# Patient Record
Sex: Female | Born: 1960 | Race: Black or African American | Hispanic: No | Marital: Single | Smoking: Never smoker
Health system: Southern US, Community
[De-identification: ages and names within clinical notes are randomized; demographics above are authoritative.]

## PROBLEM LIST (undated history)

## (undated) DIAGNOSIS — E119 Type 2 diabetes mellitus without complications: Secondary | ICD-10-CM

## (undated) DIAGNOSIS — I10 Essential (primary) hypertension: Secondary | ICD-10-CM

## (undated) DIAGNOSIS — M545 Low back pain, unspecified: Secondary | ICD-10-CM

## (undated) DIAGNOSIS — N959 Unspecified menopausal and perimenopausal disorder: Secondary | ICD-10-CM

## (undated) HISTORY — DX: Low back pain, unspecified: M54.50

## (undated) HISTORY — DX: Essential (primary) hypertension: I10

## (undated) HISTORY — DX: Type 2 diabetes mellitus without complications: E11.9

## (undated) HISTORY — DX: Low back pain: M54.5

## (undated) HISTORY — PX: TUBAL LIGATION: SHX77

## (undated) HISTORY — DX: Unspecified menopausal and perimenopausal disorder: N95.9

---

## 2009-03-09 ENCOUNTER — Encounter: Payer: Self-pay | Admitting: Family Medicine

## 2009-03-09 ENCOUNTER — Other Ambulatory Visit: Admission: RE | Admit: 2009-03-09 | Discharge: 2009-03-09 | Payer: Self-pay | Admitting: Family Medicine

## 2009-03-09 ENCOUNTER — Ambulatory Visit: Payer: Self-pay | Admitting: Family Medicine

## 2009-03-09 DIAGNOSIS — I10 Essential (primary) hypertension: Secondary | ICD-10-CM

## 2009-03-09 LAB — CONVERTED CEMR LAB: Pap Smear: NORMAL

## 2009-03-12 LAB — CONVERTED CEMR LAB
AST: 17 units/L (ref 0–37)
Albumin: 4.9 g/dL (ref 3.5–5.2)
Alkaline Phosphatase: 42 units/L (ref 39–117)
BUN: 16 mg/dL (ref 6–23)
Calcium: 9.7 mg/dL (ref 8.4–10.5)
Chloride: 102 meq/L (ref 96–112)
Creatinine, Ser: 1.02 mg/dL (ref 0.40–1.20)
LDL Cholesterol: 127 mg/dL — ABNORMAL HIGH (ref 0–99)
Potassium: 4.2 meq/L (ref 3.5–5.3)
Total Bilirubin: 0.3 mg/dL (ref 0.3–1.2)
Triglycerides: 64 mg/dL (ref <150)
VLDL: 13 mg/dL (ref 0–40)

## 2009-04-20 ENCOUNTER — Ambulatory Visit: Payer: Self-pay | Admitting: Family Medicine

## 2009-04-23 LAB — CONVERTED CEMR LAB
BUN: 18 mg/dL (ref 6–23)
CO2: 28 meq/L (ref 19–32)
Chloride: 102 meq/L (ref 96–112)
Potassium: 4 meq/L (ref 3.5–5.3)

## 2009-06-01 ENCOUNTER — Ambulatory Visit: Payer: Self-pay | Admitting: Family Medicine

## 2009-06-01 DIAGNOSIS — R079 Chest pain, unspecified: Secondary | ICD-10-CM

## 2009-06-11 ENCOUNTER — Encounter: Payer: Self-pay | Admitting: Family Medicine

## 2009-07-23 ENCOUNTER — Telehealth: Payer: Self-pay | Admitting: Family Medicine

## 2009-07-23 ENCOUNTER — Telehealth (INDEPENDENT_AMBULATORY_CARE_PROVIDER_SITE_OTHER): Payer: Self-pay | Admitting: *Deleted

## 2009-08-17 ENCOUNTER — Encounter: Payer: Self-pay | Admitting: Family Medicine

## 2009-09-21 ENCOUNTER — Encounter: Admission: RE | Admit: 2009-09-21 | Discharge: 2009-09-21 | Payer: Self-pay | Admitting: Family Medicine

## 2009-09-21 ENCOUNTER — Ambulatory Visit: Payer: Self-pay | Admitting: Family Medicine

## 2009-09-21 DIAGNOSIS — M545 Low back pain: Secondary | ICD-10-CM

## 2009-10-11 ENCOUNTER — Ambulatory Visit: Payer: Self-pay | Admitting: Family Medicine

## 2009-10-11 DIAGNOSIS — L909 Atrophic disorder of skin, unspecified: Secondary | ICD-10-CM | POA: Insufficient documentation

## 2009-10-11 DIAGNOSIS — L919 Hypertrophic disorder of the skin, unspecified: Secondary | ICD-10-CM

## 2010-03-07 ENCOUNTER — Ambulatory Visit: Payer: Self-pay | Admitting: Family Medicine

## 2010-03-07 DIAGNOSIS — N920 Excessive and frequent menstruation with regular cycle: Secondary | ICD-10-CM

## 2010-03-08 LAB — CONVERTED CEMR LAB
ALT: 12 units/L (ref 0–35)
AST: 12 units/L (ref 0–37)
Albumin: 4.2 g/dL (ref 3.5–5.2)
BUN: 16 mg/dL (ref 6–23)
Calcium: 9.1 mg/dL (ref 8.4–10.5)
Chloride: 104 meq/L (ref 96–112)
Creatinine, Ser: 1.03 mg/dL (ref 0.40–1.20)
Glucose, Bld: 94 mg/dL (ref 70–99)
HCT: 35.9 % — ABNORMAL LOW (ref 36.0–46.0)
HDL: 50 mg/dL (ref >39)
Iron: 70 ug/dL (ref 42–145)
LDL Cholesterol: 129 mg/dL — ABNORMAL HIGH (ref 0–99)
MCHC: 34 g/dL (ref 30.0–36.0)
MCV: 86.3 fL (ref 78.0–100.0)
Potassium: 4.2 meq/L (ref 3.5–5.3)
RDW: 14.5 % (ref 11.5–15.5)
Saturation Ratios: 17 % — ABNORMAL LOW (ref 20–55)
TIBC: 419 ug/dL (ref 250–470)
Total Bilirubin: 0.4 mg/dL (ref 0.3–1.2)
Triglycerides: 56 mg/dL (ref <150)
WBC: 8.3 10*3/uL (ref 4.0–10.5)

## 2010-07-18 ENCOUNTER — Ambulatory Visit: Admit: 2010-07-18 | Payer: Self-pay | Admitting: Family Medicine

## 2010-07-30 NOTE — Progress Notes (Signed)
Summary: Requests Lisinopril HCTZ  Phone Note Call from Patient   Caller: Patient Summary of Call: Pt called for results of stress test. I called [facility] for a copy bc we do not have one.   Also Pt would like to d/c Azor and start Lisinopril HCTZ 20-25. Pt was on lisinopril a few years ago and did not have any SEs. Pt c/o Azor causing chest discomfort no pain or anyother Sxs. Please advise.  Initial call taken by: Payton Spark CMA,  July 23, 2009 1:45 PM    New/Updated Medications: LISINOPRIL-HYDROCHLOROTHIAZIDE 20-25 MG TABS (LISINOPRIL-HYDROCHLOROTHIAZIDE) 1 tab by mouth daily Prescriptions: LISINOPRIL-HYDROCHLOROTHIAZIDE 20-25 MG TABS (LISINOPRIL-HYDROCHLOROTHIAZIDE) 1 tab by mouth daily  #30 x 2   Entered and Authorized by:   Seymour Bars DO   Signed by:   Seymour Bars DO on 07/23/2009   Method used:   [facility]        [facility] [phone]* (retail)       [address] [place], [place]  [address]       Ph: [phone] or [phone]       Fax: [phone]   RxID:   [id number]   Appended Document: Requests Lisinopril HCTZ Pt aware

## 2010-07-30 NOTE — Medication Information (Signed)
Summary: Possible Nonadherence with Lisinopril/[employer]  Possible Nonadherence with Lisinopril/[employer]   Imported By: Lanelle Bal 08/23/2009 12:54:09  _____________________________________________________________________  External Attachment:    Type:   Image     Comment:   External Document

## 2010-07-30 NOTE — Assessment & Plan Note (Signed)
Summary: CPE , no pap   Vital Signs:  Patient profile:   50 year old [name] status:  regular Height:      67.5 inches Weight:      201 pounds BMI:     31.13 O2 Sat:      100 % on Room air Pulse rate:   97 / minute BP sitting:   125 / 80  (left arm) Cuff size:   large  Vitals Entered By: Payton Spark CMA (March 07, 2010 9:34 AM)  O2 Flow:  Room air CC: CPE w/ out pap   Primary Care Provider:  Seymour Bars DO  CC:  CPE w/ out pap.  History of Present Illness: Marissa Ferrell presents for CPE w/o pap smear.  She is going thru perimenopause with periods occuring q 2-4 wks but w/o heavy flow.  She had a BTL for birth control.  [facility]  Denies fam hx of premature heart dz, colon cancer or breast cacner.  She is doing well on BP meds.  Due for RF and fasting labs today.  Tetanus and pap updated 1 yr ago.  No new sexual partners.    Current Medications (verified): 1)  Lisinopril-Hydrochlorothiazide 20-25 Mg Tabs (Lisinopril-Hydrochlorothiazide) .Marland Kitchen.. 1 Tab By Mouth Daily  Allergies (verified): No Known Drug Allergies  Past History:  Past Medical History: Reviewed history from Marissa/22/2010 and no changes required. premenopausal. G3P3 LBP HTN  Past Surgical History: Reviewed history from 09/Marissa/2010 and no changes required. BTL  Family History: Reviewed history from 09/Marissa/2010 and no changes required. 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  Older son and daughter live out of state. Never smoked.   2 ETOH/ wk. No exercise.  Review of Systems  The patient denies anorexia, fever, weight loss, weight gain, vision loss, decreased hearing, hoarseness, chest pain, syncope, dyspnea on exertion, peripheral edema, prolonged cough, headaches,  hemoptysis, abdominal pain, melena, hematochezia, severe indigestion/heartburn, hematuria, incontinence, genital sores, muscle weakness, suspicious skin lesions, transient blindness, difficulty walking, depression, unusual weight change, abnormal bleeding, enlarged lymph nodes, angioedema, breast masses, and testicular masses.    Physical Exam  General:  alert, well-developed, well-nourished, well-hydrated, and overweight-appearing.   Head:  normocephalic and atraumatic.   Eyes:  pupils equal, pupils round, and pupils reactive to light.   Ears:  no external deformities.   Nose:  no nasal discharge.   Mouth:  good dentition and pharynx pink and moist.   Neck:  no masses.   Lungs:  Normal respiratory effort, chest expands symmetrically. Lungs are clear to auscultation, no crackles or wheezes. Heart:  Normal rate and regular rhythm. S1 and S2 normal without gallop, murmur, click, rub or other extra sounds. Abdomen:  Bowel sounds positive,abdomen soft and non-tender without masses, organomegaly, no AA bruits Pulses:  2+ radial and pedal pulses Extremities:  no LE edema Skin:  color normal.   Cervical Nodes:  No lymphadenopathy noted Psych:  good eye contact, not anxious appearing, and not depressed appearing.     Impression & Recommendations:  Problem # 1:  PHYSICAL EXAMINATION (ICD-V70.0) Keeping healthy checklist for women reviewed. BP at goal -- RFd BP meds. BMI 31 c/w class I obesity.  Work on [employer], exercise, wt loss. Tetanus updated 2010. Pap normal 2010, repeat 2012.  Mammo updated June 2011. Update fasting labs today.   RTC in 6 mos.  Complete Medication List: 1)  Lisinopril-hydrochlorothiazide 20-25 Mg Tabs (Lisinopril-hydrochlorothiazide) .Marland Kitchen.. 1 tab by mouth daily  Other Orders: T-Comprehensive Metabolic Panel [id number]) T-Lipid Profile [id number]) T-CBC No Diff ([id number]) T-Iron ([id number]) T-Iron Binding Capacity (TIBC) ([id number]) T-TSH  ([id number])  Patient Instructions: 1)  RFd BP meds today. 2)  Update fasting labs downstairs. 3)  Will call you w/ results tomorrow. 4)  Return for HTN in 6 mos. Prescriptions: LISINOPRIL-HYDROCHLOROTHIAZIDE 20-25 MG TABS (LISINOPRIL-HYDROCHLOROTHIAZIDE) 1 tab by mouth daily  #30 Tablet x 12   Entered and Authorized by:   Seymour Bars DO   Signed by:   Seymour Bars DO on 03/07/2010   Method used:   [facility] to        [facility] [phone]* (retail)       [address] [place], [place]  [address]       Ph: [phone] or [phone]       Fax: [phone]   RxID:   [id number]

## 2010-07-30 NOTE — Progress Notes (Signed)
Summary: stress echo normal  Phone Note Outgoing Call   Summary of Call: Marcelino Duster, Pls call pt and let her know that her Stress Echo looks great.  No abnormalities seen.  She has the green light to do vigorous exercise.   Initial call taken by: Seymour Bars DO,  July 23, 2009 4:28 PM  Follow-up for Phone Call        Pt aware Follow-up by: Payton Spark CMA,  July 24, 2009 11:35 AM
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
## 2010-07-30 NOTE — Assessment & Plan Note (Signed)
Summary: f/u back pain   Vital Signs:  Patient profile:   50 year old female Menstrual status:  regular Height:      67.5 inches Weight:      204 pounds BMI:     31.59 O2 Sat:      98 % on Room air Pulse rate:   103 / minute BP sitting:   119 / 81  (left arm) Cuff size:   large  Vitals Entered By: Payton Spark CMA (October 11, 2009 8:36 AM)  O2 Flow:  Room air CC: F/U back pain. Pain is better w/ meds but comes and goes.    Primary Care Provider:  Seymour Bars DO  CC:  F/U back pain. Pain is better w/ meds but comes and goes. .  History of Present Illness: 50 yo AAF presents for f/u LBP.  She feels about 50% better from last visit 2 wks ago.  She was treated here with Ibuprofen 800 mg three times a day with meals, Flexeril at night and home PT stretches.  She had an XRAY that was essentially normal.  She has hx of chronic LBP.  Has been walking more.  Denies any radiation of her LBP.    She is still needing to take Ibuprofen 2 x a wk.  She wants to hold off on formal PT due to her work schedule.  She has a bothersome skin tag on the R axillae which she would like removed.      Current Medications (verified): 1)  Lisinopril-Hydrochlorothiazide 20-25 Mg Tabs (Lisinopril-Hydrochlorothiazide) .Marland Kitchen.. 1 Tab By Mouth Daily 2)  Ibuprofen 800 Mg Tabs (Ibuprofen) .Marland Kitchen.. 1 Tab By Mouth Three Times A Day With Meals X 10 Days 3)  Flexeril 5 Mg Tabs (Cyclobenzaprine Hcl) .Marland Kitchen.. 1-2 Tabs By Mouth At Bedtime As Needed Back Pain  Allergies (verified): No Known Drug Allergies  Past History:  Past Medical History: Reviewed history from 04/20/2009 and no changes required. premenopausal. 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  Older son and daughter live out of state. Never smoked.   2 ETOH/ wk. No exercise.  Review of Systems      See HPI  Physical  Exam  General:  alert, well-developed, well-nourished, and well-hydrated.  obese Head:  normocephalic and atraumatic.   Eyes:  pupils equal, pupils round, and pupils reactive to light.   Lungs:  Normal respiratory effort, chest expands symmetrically. Lungs are clear to auscultation, no crackles or wheezes. Heart:  Normal rate and regular rhythm. S1 and S2 normal without gallop, murmur, click, rub or other extra sounds. Msk:  full active L spine ROM slightly tender midline L4-S1 Neg seated straight leg raise normal gait Extremities:  no LE edema Neurologic:  +2/4 patellar DTRs Skin:  color normal.  medium sized pedunculated skin tag under the R axillae   Impression & Recommendations:  Problem # 1:  BACK PAIN, LUMBAR (ICD-724.2) Assessment Improved Her acute on chronic LBP has improved about 50% with increased walking, flexeril at night.  She needs to work more on home PT exercises, wt loss.  She has backed off on NSAID use to just as needed.  XRay was essentially normal.  Call if failing to improve -- to schedule formal PT. Her updated medication list for this problem includes:    Ibuprofen 800 Mg Tabs (Ibuprofen) .Marland Kitchen... 1 tab by mouth three times a day with  meals x 10 days    Flexeril 5 Mg Tabs (Cyclobenzaprine hcl) .Marland Kitchen... 1-2 tabs by mouth at bedtime as needed back pain  Problem # 2:  ESSENTIAL HYPERTENSION, BENIGN (ICD-401.1) Assessment: Improved  Her updated medication list for this problem includes:    Lisinopril-hydrochlorothiazide 20-25 Mg Tabs (Lisinopril-hydrochlorothiazide) .Marland Kitchen... 1 tab by mouth daily  BP today: 119/81 Prior BP: 152/88 (09/21/2009)  Labs Reviewed: K+: 4.0 (04/20/2009) Creat: : 1.14 (04/20/2009)   Chol: 204 (03/09/2009)   HDL: 64 (03/09/2009)   LDL: 127 (03/09/2009)   TG: 64 (03/09/2009)  Problem # 3:  SKIN TAG (ICD-701.9) R axillary skin tag frozen at base with liquid nitrogen. Wound care measures discussed. If it fails to fall off, she will need to  return to snip it. Will not charge her in the event that she needs to return for the same lesion.  Complete Medication List: 1)  Lisinopril-hydrochlorothiazide 20-25 Mg Tabs (Lisinopril-hydrochlorothiazide) .Marland Kitchen.. 1 tab by mouth daily 2)  Ibuprofen 800 Mg Tabs (Ibuprofen) .Marland Kitchen.. 1 tab by mouth three times a day with meals x 10 days 3)  Flexeril 5 Mg Tabs (Cyclobenzaprine hcl) .Marland Kitchen.. 1-2 tabs by mouth at bedtime as needed back pain  Patient Instructions: 1)  Skin tag will fall off.  Clean with soap and water and use polysporin ointment to prevent infection for the next wk. 2)  Continue regular walking, low back stretching with as needed use of Ibuprofen for back pain. 3)  Call if you want to add some formal physical therapy. Prescriptions: FLEXERIL 5 MG TABS (CYCLOBENZAPRINE HCL) 1-2 tabs by mouth at bedtime as needed back pain  #24 x 0   Entered and Authorized by:   Seymour Bars DO   Signed by:   Seymour Bars DO on 10/11/2009   Method used:   Electronically to        [facility] [phone]* (retail)       [address] [place], [place]  [address]       Ph: [phone] or [phone]       Fax: [phone]   RxID:   [id number]

## 2010-09-04 ENCOUNTER — Encounter: Payer: Self-pay | Admitting: Family Medicine

## 2010-09-04 ENCOUNTER — Ambulatory Visit (INDEPENDENT_AMBULATORY_CARE_PROVIDER_SITE_OTHER): Payer: Commercial Indemnity | Admitting: Family Medicine

## 2010-09-04 DIAGNOSIS — I10 Essential (primary) hypertension: Secondary | ICD-10-CM

## 2010-09-04 DIAGNOSIS — H9209 Otalgia, unspecified ear: Secondary | ICD-10-CM | POA: Insufficient documentation

## 2010-09-10 NOTE — Assessment & Plan Note (Signed)
Summary: cerumen impaction   Vital Signs:  Patient profile:   50 year old female Menstrual status:  regular Height:      67.5 inches Weight:      205 pounds BMI:     31.75 O2 Sat:      99 % on Room air Pulse rate:   92 / minute BP sitting:   154 / 99  (left arm) Cuff size:   large  Vitals Entered By: Payton Spark CMA (September 04, 2010 8:37 AM)  O2 Flow:  Room air CC: L aer pain x months.   Primary Care Provider:  Seymour Bars DO  CC:  L aer pain x months..  History of Present Illness: 50 yo AAF presents for L > R ear pain x 3 mos.  She denies sore throat, runny nose, allergies.  Denies any HAs.  No fevers.  No drainage.  Denies tinnitus or hearing loss.  No hx of ear problems.  Has not been taking anything.  She has a constant deep aching.  She has been using Q tips.  Denies pain in her jaw or tooth pain with chewing.  Current Medications (verified): 1)  Lisinopril-Hydrochlorothiazide 20-25 Mg Tabs (Lisinopril-Hydrochlorothiazide) .Marland Kitchen.. 1 Tab By Mouth Daily  Allergies (verified): No Known Drug Allergies  Past History:  Past Medical History: Reviewed history from 04/20/2009 and no changes required. premenopausal. 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  Older son and daughter live out of state. Never smoked.   2 ETOH/ wk. No exercise.  Review of Systems      See HPI  Physical Exam  General:  alert, well-developed, well-nourished, and well-hydrated.   Head:  normocephalic and atraumatic.   Eyes:  conjunctiva clear Ears:  R TM normal.  deep small L cerumen impaction Nose:  no nasal discharge.   Mouth:  pharynx pink and moist.   Neck:  no masses.   Lungs:  Normal respiratory effort, chest expands symmetrically. Lungs are clear to auscultation, no crackles or wheezes. Heart:  Normal rate and regular rhythm. S1 and S2 normal without gallop,  murmur, click, rub or other extra sounds. Extremities:  no LE edema Skin:  color normal.   Cervical Nodes:  No lymphadenopathy noted Psych:  good eye contact, not anxious appearing, and flat affect.     Impression & Recommendations:  Problem # 1:  EAR PAIN, LEFT (ICD-388.70) L ear pain likley to be purely from deep L cerumen impaction secondary to use of Q tips. Will irrigate today and if unsuccessful, refer to ENT for microscopic removal.--> successfully removed and TM appears normal. Avoid Q tips -- change to baby safety swabs. Hx / exam neg for other causes of referred ear pain.  Problem # 2:  ESSENTIAL HYPERTENSION, BENIGN (ICD-401.1) BP high today, previously at goal.  Has not taken her med yet this morning. Pain may be raising it higher.  Reminded pt to take her medicine today and will see her back for f/u in 1 month. Her updated medication list for this problem includes:    Lisinopril-hydrochlorothiazide 20-25 Mg Tabs (Lisinopril-hydrochlorothiazide) .Marland Kitchen... 1 tab by mouth daily  BP today: 154/99 Prior BP: 125/80 (03/07/2010)  Labs Reviewed: K+: 4.2 (03/07/2010) Creat: : 1.03 (03/07/2010)   Chol: 190 (03/07/2010)   HDL: 50 (03/07/2010)   LDL: 129 (03/07/2010)   TG: 56 (03/07/2010)  Complete Medication List: 1)  Lisinopril-hydrochlorothiazide  20-25 Mg Tabs (Lisinopril-hydrochlorothiazide) .Marland Kitchen.. 1 tab by mouth daily  Patient Instructions: 1)  L ear irrigated today. 2)  Avoid Qtips -- changes to the thicker baby Qtips (saftey Q tips). 3)  Take BP medicine everyday. 4)  REturn for f/u in 1 month.   Orders Added: 1)  Est. Patient Level III [[address]]

## 2011-04-13 ENCOUNTER — Encounter: Payer: Self-pay | Admitting: Family Medicine

## 2011-04-13 ENCOUNTER — Inpatient Hospital Stay (INDEPENDENT_AMBULATORY_CARE_PROVIDER_SITE_OTHER)
Admission: RE | Admit: 2011-04-13 | Discharge: 2011-04-13 | Disposition: A | Discharge status: Home / Self Care | Payer: Commercial Indemnity | Source: Ambulatory Visit | Attending: Family Medicine | Admitting: Family Medicine

## 2011-04-13 DIAGNOSIS — L259 Unspecified contact dermatitis, unspecified cause: Secondary | ICD-10-CM

## 2011-04-13 DIAGNOSIS — I10 Essential (primary) hypertension: Secondary | ICD-10-CM

## 2011-04-19 ENCOUNTER — Telehealth (INDEPENDENT_AMBULATORY_CARE_PROVIDER_SITE_OTHER): Payer: Self-pay

## 2011-04-28 ENCOUNTER — Telehealth (INDEPENDENT_AMBULATORY_CARE_PROVIDER_SITE_OTHER): Payer: Self-pay | Admitting: *Deleted

## 2011-05-01 ENCOUNTER — Encounter: Payer: Self-pay | Admitting: Family Medicine

## 2011-05-08 ENCOUNTER — Encounter: Payer: Self-pay | Admitting: Family Medicine

## 2011-05-08 ENCOUNTER — Ambulatory Visit (INDEPENDENT_AMBULATORY_CARE_PROVIDER_SITE_OTHER): Payer: Commercial Indemnity | Admitting: Family Medicine

## 2011-05-08 VITALS — BP 162/99 | Ht 66.0 in | Wt 213.0 lb

## 2011-05-08 DIAGNOSIS — L989 Disorder of the skin and subcutaneous tissue, unspecified: Secondary | ICD-10-CM

## 2011-05-08 DIAGNOSIS — I10 Essential (primary) hypertension: Secondary | ICD-10-CM

## 2011-05-08 MED ORDER — LISINOPRIL-HYDROCHLOROTHIAZIDE 20-25 MG PO TABS: 1.0000 | ORAL_TABLET | Freq: Every day | ORAL | Status: DC

## 2011-05-08 MED ORDER — VERAPAMIL HCL ER 180 MG PO TBCR: 180.0000 mg | EXTENDED_RELEASE_TABLET | Freq: Every day | ORAL | Status: DC

## 2011-05-08 NOTE — Patient Instructions (Signed)
Excision of Skin Lesions Excision of a skin lesion refers to the removal of a section of skin by making small cuts (incisions) in the skin. This is typically done to remove a cancerous growth (basal cell carcinoma, squamous cell carcinoma, or melanoma) or a noncancerous growth (cyst). It may be done to treat or prevent cancer or infection. It may also be done to improve cosmetic appearance (removal of mole, skin tag). LET YOUR CAREGIVER KNOW ABOUT:   Allergies to food or medicine.   Medicines taken, including vitamins, herbs, eyedrops, over-the-counter medicines, and creams.   Use of steroids (by mouth or creams).   Previous problems with anesthetics or numbing medicines.   History of bleeding problems or blood clots.   History of any prostheses.   Previous surgery.   Other health problems, including diabetes and kidney problems.   Possibility of pregnancy, if this applies.  RISKS AND COMPLICATIONS  Many complications can be managed. With appropriate treatment and rehabilitation, the following complications are very uncommon:  Bleeding.   Infection.   Scarring.   Recurrence of cyst or cancer.   Changes in skin sensation or appearance (discoloration, swelling).   Reaction to anesthesia.   Allergic reaction to surgical materials or ointments.   Damage to nerves, blood vessels, muscles, or other structures.   Continued pain.  BEFORE THE PROCEDURE  It is important to follow your caregiver's instructions prior to your procedure to avoid complications. Steps before your procedure may include:  Physical exam, blood tests, other procedures, such as removing a small sample for examination under a microscope (biopsy).   Your caregiver may review the procedure, the anesthesia being used, and what to expect after the procedure with you.  You may be asked to:  Stop taking certain medicines, such as blood thinners (including aspirin, clopidogrel, ibuprofen), for several days prior  to your procedure.   Take certain medicines.   Stop smoking.  It is a good idea to arrange for a ride home after surgery and to have someone to help you with activities during recovery. PROCEDURE  There are several excision techniques. The type of excision or surgical technique used will depend on your condition, the location of the lesion, and your overall health. After the lesion is sterilized and a local anesthetic is applied, the following may be performed: Complete surgical excision The area to be removed is marked with a pen. Using a small scalpel and scissors, the surgeon gently cuts around and under the lesion until it is completely removed. The lesion is placed in a special fluid and sent to the lab for examination. If necessary, bleeding will be controlled with a device that delivers heat. The edges of the wound are stitched together and a dressing is applied. This procedure may be performed to treat a cancerous growth or noncancerous cyst or lesion. Surgeons commonly perform an elliptical excision, to minimize scarring. Excision of a cyst The surgeon makes an incision on the cyst. The entire cyst is removed through the incision. The wound may be closed with a suture (stitch). Shave excision During shave excision, the surgeon uses a small blade or loop instrument to shave off the lesion. This may be done to remove a mole or skin tag. The wound is usually left to heal on its own without stitches. Punch excision During punch excision, the surgeon uses a small, round tool (like a cookie cutter) to cut a circle shape out of the skin. The outer edges of the skin are stitched  together. This may be done to remove a mole or scar or to perform a biopsy of the lesion. Mohs micrographic surgery During Mohs micrographic surgery, layers of the lesion are removed with a scalpel or loop instrument and immediately examined under a microscope until all of the abnormal or cancerous tissue is removed. This  procedure is minimally invasive and ensures the best cosmetic outcome, with removal of as little normal tissue as possible. Mohs is usually done to treat skin cancer, such as basal cell carcinoma or squamous cell carcinoma, particularly on the face and ears. Antibiotic ointment is applied to the surgical area after each of the procedures listed above, as necessary. AFTER THE PROCEDURE  How well you heal depends on many factors. Most patients heal quite well with proper techniques and self-care. Scarring will lessen over time. HOME CARE INSTRUCTIONS   Take medicines for pain as directed.   Keep the incision area clean, dry, and protected for at least 48 hours. Change dressings as directed.   For bleeding, apply gentle but firm pressure to the wound using a folded towel for 20 minutes. Call your caregiver if bleeding does not stop.   Avoid high-impact exercise and activities until the stitches are removed or the area heals.   Follow your caregiver's instructions to minimize scarring. Avoid sun exposure until the area has healed. Scarring should lessen over time.   Follow up with your caregiver as directed. Removal of stitches within 4 to 14 days may be necessary.  Finding out the results of your test Not all test results are available during your visit. If your test results are not back during the visit, make an appointment with your caregiver to find out the results. Do not assume everything is normal if you have not heard from your caregiver or the medical facility. It is important for you to follow up on all of your test results. SEEK MEDICAL CARE IF:   You or your child has an oral temperature above 102 F (38.9 C).   You develop signs of infection (chills, feeling unwell).   You notice bleeding, pain, discharge, redness, or swelling at the incision site.   You notice skin irregularities or changes in sensation.  MAKE SURE YOU:   Understand these instructions.   Will watch your  condition.   Will get help right away if you are not doing well or get worse.  FOR MORE INFORMATION  American Academy [employer]: www.https://powers.com/ American Academy of Dermatology: InfoExam.si Document Released: 09/10/2009 Document Revised: 02/26/2011 Document Reviewed: 09/10/2009 [facility] Patient Information 2012 [place], [place].Hypertension As your heart beats, it forces blood through your arteries. This force is your blood pressure. If the pressure is too high, it is called hypertension (HTN) or high blood pressure. HTN is dangerous because you may have it and not know it. High blood pressure may mean that your heart has to work harder to pump blood. Your arteries may be narrow or stiff. The extra work puts you at risk for heart disease, stroke, and other problems.  Blood pressure consists of two numbers, a higher number over a lower, 110/72, for example. It is stated as "110 over 72." The ideal is below 120 for the top number (systolic) and under 80 for the bottom (diastolic). Write down your blood pressure today. You should pay close attention to your blood pressure if you have certain conditions such as:  Heart failure.   Prior heart attack.   Diabetes   Chronic kidney disease.  Prior stroke.   Multiple risk factors for heart disease.  To see if you have HTN, your blood pressure should be measured while you are seated with your arm held at the level of the heart. It should be measured at least twice. A one-time elevated blood pressure reading (especially in the Emergency Department) does not mean that you need treatment. There may be conditions in which the blood pressure is different between your right and left arms. It is important to see your caregiver soon for a recheck. Most people have essential hypertension which means that there is not a specific cause. This type of high blood pressure may be lowered by changing lifestyle factors such as:  Stress.   Smoking.   Lack  of exercise.   Excessive weight.   Drug/tobacco/alcohol use.   Eating less salt.  Most people do not have symptoms from high blood pressure until it has caused damage to the body. Effective treatment can often prevent, delay or reduce that damage. TREATMENT  When a cause has been identified, treatment for high blood pressure is directed at the cause. There are a large number of medications to treat HTN. These fall into several categories, and your caregiver will help you select the medicines that are best for you. Medications may have side effects. You should review side effects with your caregiver. If your blood pressure stays high after you have made lifestyle changes or started on medicines,   Your medication(s) may need to be changed.   Other problems may need to be addressed.   Be certain you understand your prescriptions, and know how and when to take your medicine.   Be sure to follow up with your caregiver within the time frame advised (usually within two weeks) to have your blood pressure rechecked and to review your medications.   If you are taking more than one medicine to lower your blood pressure, make sure you know how and at what times they should be taken. Taking two medicines at the same time can result in blood pressure that is too low.  SEEK IMMEDIATE MEDICAL CARE IF:  You develop a severe headache, blurred or changing vision, or confusion.   You have unusual weakness or numbness, or a faint feeling.   You have severe chest or abdominal pain, vomiting, or breathing problems.  MAKE SURE YOU:   Understand these instructions.   Will watch your condition.   Will get help right away if you are not doing well or get worse.  Document Released: 06/16/2005 Document Revised: 02/26/2011 Document Reviewed: 02/04/2008 [facility] Patient Information 2012 [place], [place].

## 2011-05-08 NOTE — Progress Notes (Signed)
  Subjective:    Patient ID: Marissa Ferrell, female    DOB: [birth year], 51 y.o.   MRN: [id number]  HPI Patient has history of hypertension she's been on Prinzide for least a year 20/25 and initially uncontrolled blood pressure but states the last several months at work they've noticed her blood pressures continued to go up. Pressure medicine before but does not know the name. #2 mild hyperlipidemia we'll need to check her cholesterol in the near future if his LDL cholesterol has been staying we need to. #3 skin lesion she reports multiple skin lesions on her chest one on the right chest under poor and 2 on the left under. Because of the location these lesions loation with her bra they have  become irritated and causing some discomfort as well as.    Review of Systems    Essentialy negative.  Objective:   Physical Exam  Constitutional: She is oriented to person, place, and time. She appears well-developed and well-nourished.  HENT:  Head: Normocephalic and atraumatic.  Neck: Normal range of motion. Neck supple.  Cardiovascular: Normal rate, regular rhythm and normal heart sounds.   Pulmonary/Chest: Effort normal and breath sounds normal.  Neurological: She is alert and oriented to person, place, and time.  Skin: Skin is warm and dry.   03 pedunculated lesions on the chest wall and one on the left is about 0.5 cm the other 2 smaller ones are pretty irritated from her bra rubbing against them.  BP 162/99  Ht 5\' 6"  (1.676 m)  Wt 213 lb (96.616 kg)  BMI 34.38 kg/m2  SpO2 96%  LMP 10/29/2010  Procedure note using towel surgery done the 3 lesions along the chest wall were frozen she tolerated procedure well hopefully these skin lesions will now followup known we'll recheck in 4 months when she returns.     Assessment & Plan:    # 1 hypertension under poor control continue he is Zestoretic 20/25 we'll add verapamil or Kaelin 180 sustained release once by mouth daily. Followup of 4 months for  followup  #2 health maintenance schedule for physical when she returns in 4 months  #3 skin lesions we'll retreat if they returned or if not sloughed offin 4 months. #4 followup of cholesterol when she wreturns

## 2011-05-26 ENCOUNTER — Telehealth: Payer: Self-pay | Admitting: *Deleted

## 2011-05-26 MED ORDER — CYCLOBENZAPRINE HCL 5 MG PO TABS: 5.0000 mg | ORAL_TABLET | Freq: Two times a day (BID) | ORAL | Status: DC | PRN

## 2011-05-26 NOTE — Telephone Encounter (Signed)
Refill on felxeril needed

## 2011-06-02 NOTE — Telephone Encounter (Signed)
  Phone Note Call from Patient   Caller: Patient Reason for Call: Talk to Nurse Summary of Call: Patient reports she is on her last day of her antibiotic and is still having some drainage. After consulting with Dr. Orson Aloe, I called in a refill of the cipro for 5 days. I encouraged her to followup with her PCPs office. Initial call taken by: Lajean Saver RN,  April 28, 2011 8:22 AM

## 2011-06-02 NOTE — Progress Notes (Signed)
Summary: ALLERGIC REACTION hair dye; out of HTN meds   Vital Signs:  Patient Profile:   50 Years Old Female CC:      rxn to hair dye; out of anti-hypertensive meds (Dr.Bowen) Height:     67.5 inches Weight:      210 pounds O2 Sat:      98 % O2 treatment:    Room Air Temp:     98.1 degrees F oral Pulse rate:   88 / minute Resp:     16 per minute BP sitting:   150 / 89  (left arm) Cuff size:   large  Pt. in pain?   no  Vitals Entered By: Lavell Islam RN (April 13, 2011 1:38 PM)              Comments has been out of lisinopril x 1 week      Updated Prior Medication List: LISINOPRIL-HYDROCHLOROTHIAZIDE 20-25 MG TABS (LISINOPRIL-HYDROCHLOROTHIAZIDE) 1 tab by mouth daily  Current Allergies: ! * POSSIBLE HAIR DYEHistory of Present Illness Chief Complaint: rxn to hair dye; out of anti-hypertensive meds (Dr.Bowen) History of Present Illness:  Subjective:  Patient dyed her hair 3 days ago and subsequently developed swelling and clear drainage from her scalp.  Minimal pain.  Swelling is now extending to forehead.  No fevers, chills, and sweats  She states that she has run out of her BP meds.  She reports no adverse reaction to her medication and tolerates well.  REVIEW OF SYSTEMS Constitutional Symptoms      Denies fever, chills, night sweats, weight loss, weight gain, and fatigue.  Eyes       Denies change in vision, eye pain, eye discharge, glasses, contact lenses, and eye surgery. Ear/Nose/Throat/Mouth       Denies hearing loss/aids, change in hearing, ear pain, ear discharge, dizziness, frequent runny nose, frequent nose bleeds, sinus problems, sore throat, hoarseness, and tooth pain or bleeding.  Respiratory       Denies dry cough, productive cough, wheezing, shortness of breath, asthma, bronchitis, and emphysema/COPD.  Cardiovascular       Denies murmurs, chest pain, and tires easily with exhertion.    Gastrointestinal       Denies stomach pain, nausea/vomiting,  diarrhea, constipation, blood in bowel movements, and indigestion. Genitourniary       Denies painful urination, kidney stones, and loss of urinary control. Neurological       Denies paralysis, seizures, and fainting/blackouts. Musculoskeletal       Denies muscle pain, joint pain, joint stiffness, decreased range of motion, redness, swelling, muscle weakness, and gout.  Skin       Denies bruising, unusual mles/lumps or sores, and hair/skin or nail changes.      Comments: face/hair line Psych       Denies mood changes, temper/anger issues, anxiety/stress, speech problems, depression, and sleep problems. Other Comments: possible rxn hair dye; out of anti-hypertensive rx   Past History:  Past Medical History: Reviewed history from 04/20/2009 and no changes required. premenopausal. G3P3 LBP HTN  Past Surgical History: Reviewed history from 03/09/2009 and no changes required. BTL  Family History: Reviewed history from 03/09/2009 and no changes required. father died of pneumonia mother HTN 4 brothers and 4 sisters healthy   Objective:  No acute distress  Scalp:  Clear mucoid drainage from scalp at various locations. Face:  Mild swelling of forehead at scalp margin; no erythema or tenderness Eyes:  Pupils are equal, round, and reactive to light and accomodation.  Extraocular movement is intact.  Conjunctivae are not inflamed.  Mouth:  No lesions Neck:  Supple.  No adenopathy is present.  No thyromegaly is present  Lungs:  Clear to auscultation.  Breath sounds are equal.  Heart:  Regular rate and rhythm without murmurs, rubs, or gallops.  Abdomen:  Nontender without masses or hepatosplenomegaly.  Bowel sounds are present.  No CVA or flank tenderness.  Extremities:  No edema.   Assessment New Problems: HYPERTENSION, BENIGN ESSENTIAL (ICD-401.1) CONTACT DERMATITIS (ICD-692.9)  SUSPECT SCALP SECONDARY BACTERIAL INFECTION  Plan New Medications/Changes: CEPHALEXIN 500 MG TABS  (CEPHALEXIN) One by mouth three times daily (every 8 hours)  #30 x 0, 04/13/2011, Donna Christen MD PREDNISONE 10 MG TABS (PREDNISONE) 2 PO BID for 2 days, then 1 BID for 2 days, then 1 daily for 2 days.  Take PC (Start Monday)  #14 x 0, 04/13/2011, Donna Christen MD LISINOPRIL-HYDROCHLOROTHIAZIDE 20-25 MG TABS (LISINOPRIL-HYDROCHLOROTHIAZIDE) 1 tab by mouth daily  #90 x 0, 04/13/2011, Donna Christen MD  New Orders: Depo- Medrol 80mg  [J1040] T-Culture, Wound [[id number]] Admin of Therapeutic Inj  intramuscular or subcutaneous [96372] New Patient Level IV [99204] Services provided After hours-Weekends-Holidays [99051] Planning Comments:   Wound culture taken.  Begin Keflex. DepoMedrol 80mg  IM; tomorrow begin tapering course of prednisone. Follow-up with PCP if not improving 2 to 3 days. Refill BP med; follow-up with PCP for BP.   The patient and/or caregiver has been counseled thoroughly with regard to medications prescribed including dosage, schedule, interactions, rationale for use, and possible side effects and they verbalize understanding.  Diagnoses and expected course of recovery discussed and will return if not improved as expected or if the condition worsens. Patient and/or caregiver verbalized understanding.  Prescriptions: CEPHALEXIN 500 MG TABS (CEPHALEXIN) One by mouth three times daily (every 8 hours)  #30 x 0   Entered and Authorized by:   Donna Christen MD   Signed by:   Donna Christen MD on 04/13/2011   Method used:   Print then Give to Patient   RxID:   [id number] PREDNISONE 10 MG TABS (PREDNISONE) 2 PO BID for 2 days, then 1 BID for 2 days, then 1 daily for 2 days.  Take PC (Start Monday)  #14 x 0   Entered and Authorized by:   Donna Christen MD   Signed by:   Donna Christen MD on 04/13/2011   Method used:   Print then Give to Patient   RxID:   [id number] LISINOPRIL-HYDROCHLOROTHIAZIDE 20-25 MG TABS (LISINOPRIL-HYDROCHLOROTHIAZIDE) 1 tab by mouth daily  #90 x  0   Entered and Authorized by:   Donna Christen MD   Signed by:   Donna Christen MD on 04/13/2011   Method used:   Print then Give to Patient   RxID:   [id number]   Medication Administration  Injection # 1:    Medication: Depo- Medrol 80mg     Diagnosis: CONTACT DERMATITIS (ICD-692.9)    Route: IM    Site: LUOQ gluteus    Exp Date: 05/2011    Lot #: [id number]    Patient tolerated injection without complications    Given by: Lavell Islam RN (April 13, 2011 2:16 PM)  Orders Added: 1)  Depo- Medrol 80mg  [J1040] 2)  T-Culture, Wound [[id number]] 3)  Admin of Therapeutic Inj  intramuscular or subcutaneous [96372] 4)  New Patient Level IV [99204] 5)  Services provided After hours-Weekends-Holidays [99051]

## 2011-06-02 NOTE — Telephone Encounter (Signed)
Summary: RX change request  Phone Note Call from Patient   Summary of Call: Pt. called requesting "stronger"Atb then what was RX'd at previous visit. States "not working". Please advise.[phone] [facility]. [place]    New/Updated Medications: CIPROFLOXACIN HCL 500 MG TABS (CIPROFLOXACIN HCL) 1 by mouth q12hr Prescriptions: CIPROFLOXACIN HCL 500 MG TABS (CIPROFLOXACIN HCL) 1 by mouth q12hr  #20 x 0   Entered and Authorized by:   Donna Christen MD   Signed by:   Donna Christen MD on 04/20/2011   Method used:   Electronically to        [facility] [phone]* (retail)       [address] [place], [place]  [address]       Ph: [phone] or [phone]       Fax: [phone]   RxID:   [id number]  Discussed with patient; she states that she received new Rx yesterday. Donna Christen MD  April 20, 2011 1:54 PM

## 2011-08-21 ENCOUNTER — Ambulatory Visit (INDEPENDENT_AMBULATORY_CARE_PROVIDER_SITE_OTHER): Payer: 59 | Admitting: Family Medicine

## 2011-08-21 ENCOUNTER — Encounter: Payer: Self-pay | Admitting: Family Medicine

## 2011-08-21 VITALS — BP 133/83 | HR 94 | Ht 66.0 in | Wt 215.0 lb

## 2011-08-21 DIAGNOSIS — Z0000 Encounter for general adult medical examination without abnormal findings: Secondary | ICD-10-CM

## 2011-08-21 DIAGNOSIS — I10 Essential (primary) hypertension: Secondary | ICD-10-CM

## 2011-08-21 LAB — POCT URINALYSIS DIPSTICK
Blood, UA: NEGATIVE
Glucose, UA: NEGATIVE
Protein, UA: NEGATIVE
Spec Grav, UA: 1.01
pH, UA: 6.5

## 2011-08-21 MED ORDER — LISINOPRIL-HYDROCHLOROTHIAZIDE 20-25 MG PO TABS: 1.0000 | ORAL_TABLET | Freq: Every day | ORAL | Status: DC

## 2011-08-21 MED ORDER — VERAPAMIL HCL ER 240 MG PO TBCR: 240.0000 mg | EXTENDED_RELEASE_TABLET | Freq: Every day | ORAL | Status: DC

## 2011-08-21 NOTE — Progress Notes (Signed)
Subjective:    Patient ID: Marissa Ferrell, female    DOB: January 14, 1961, 51 y.o.   MRN: [id number]  HPI Patient's here for physical examination. She does not or has declined Pap smear breast exam rectal exam since that is done by her GYN.  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 She started taking a fluid supplement over the counter she feels as that has helped her diuresis. We have talked about increasing her blood pressure medicine that she is on and see if that will keep her blood pressure under better control.  Review of Systems  HENT: Positive for congestion, rhinorrhea and sneezing.        URI since Sunday  Respiratory: Positive for cough.   All other systems reviewed and are negative.   BP 133/83  Pulse 94  Ht 5\' 6"  (1.676 m)  Wt 215 lb (97.523 kg)  BMI 34.70 kg/m2  SpO2 98% No Known Allergies History   Social History  . Marital Status: Single    Spouse Name: N/A    Number of Children: N/A  . Years of Education: N/A   Occupational History  . Not on file.   Social History Main Topics  . Smoking status: Never Smoker   . Smokeless tobacco: Not on file  . Alcohol Use: 1.0 oz/week    2 drink(s) per week     per week  . Drug Use:   . 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   Other Topics Concern  . Not on file   Social History Narrative  . No narrative on file   Family History  Problem Relation Age of Onset  . Hypertension Mother    Past Medical History  Diagnosis Date  . Premenopausal patient   . LBP (low back pain)   . Hypertension    Past Surgical History  Procedure Date  . Tubal ligation   . Cesarean section          BP 133/83  Pulse 94  Ht 5\' 6"  (1.676 m)  Wt 215 lb (97.523 kg)  BMI 34.70 kg/m2  SpO2 98% Objective:   Physical Exam  Vitals reviewed. Constitutional: She is oriented to person, place, and time. She appears well-developed and well-nourished.  HENT:    Head: Normocephalic and atraumatic.  Right Ear: External ear normal.  Left Ear: External ear normal.  Eyes: Pupils are equal, round, and reactive to light. Right eye exhibits no discharge. Left eye exhibits no discharge.  Fundoscopic exam:      The right eye shows no arteriolar narrowing and no AV nicking.       The left eye shows no arteriolar narrowing and no AV nicking.  Neck: Normal range of motion. Neck supple.  Cardiovascular: Normal rate, regular rhythm and normal heart sounds.   Pulmonary/Chest: Effort normal and breath sounds normal. No respiratory distress. She has no wheezes. She has no rales.  Abdominal: Soft. Bowel sounds are normal. She exhibits no distension. There is no tenderness.  Musculoskeletal: Normal range of motion.  Neurological: She is alert and oriented to person, place, and time. She has normal reflexes.  Skin: Skin is warm and dry.  Psychiatric: She has a normal mood and affect. Her behavior is normal. Thought content normal.   EKG sinus  Patient declines breast exam, pelvic or rectal exam as that is done by her GYN .   Assessment & Plan:  #1  Health maintance Declines flu vaccination obtain essentials labs #2 Hypertension increases Verlan to 240 and we'll see if her blood pressure stays under better control. #3 URI at this time no new treatment however if her symptoms remain by Tuesday asked her to please give me a call. Follow up in 6 months

## 2011-08-21 NOTE — Patient Instructions (Signed)
Upper Respiratory Infection, Adult An upper respiratory infection (URI) is also known as the common cold. It is often caused by a type of germ (virus). Colds are easily spread (contagious). You can pass it to others by kissing, coughing, sneezing, or drinking out of the same glass. Usually, you get better in 1 or 2 weeks.  HOME CARE   Only take medicine as told by your doctor.   Use a warm mist humidifier or breathe in steam from a hot shower.   Drink enough water and fluids to keep your pee (urine) clear or pale yellow.   Get plenty of rest.   Return to work when your temperature is back to normal or as told by your doctor. You may use a face mask and wash your hands to stop your cold from spreading.  GET HELP RIGHT AWAY IF:   After the first few days, you feel you are getting worse.   You have questions about your medicine.   You have chills, shortness of breath, or brown or red spit (mucus).   You have yellow or brown snot (nasal discharge) or pain in the face, especially when you bend forward.   You have a fever, puffy (swollen) neck, pain when you swallow, or white spots in the back of your throat.   You have a bad headache, ear pain, sinus pain, or chest pain.   You have a high-pitched whistling sound when you breathe in and out (wheezing).   You have a lasting cough or cough up blood.   You have sore muscles or a stiff neck.  MAKE SURE YOU:   Understand these instructions.   Will watch your condition.   Will get help right away if you are not doing well or get worse.  Document Released: 12/03/2007 Document Revised: 02/26/2011 Document Reviewed: 10/21/2010 [facility] Patient Information 2012 [place], [place].Hypertension As your heart beats, it forces blood through your arteries. This force is your blood pressure. If the pressure is too high, it is called hypertension (HTN) or high blood pressure. HTN is dangerous because you may have it and not know it. High blood  pressure may mean that your heart has to work harder to pump blood. Your arteries may be narrow or stiff. The extra work puts you at risk for heart disease, stroke, and other problems.  Blood pressure consists of two numbers, a higher number over a lower, 110/72, for example. It is stated as "110 over 72." The ideal is below 120 for the top number (systolic) and under 80 for the bottom (diastolic). Write down your blood pressure today. You should pay close attention to your blood pressure if you have certain conditions such as:  Heart failure.   Prior heart attack.   Diabetes   Chronic kidney disease.   Prior stroke.   Multiple risk factors for heart disease.  To see if you have HTN, your blood pressure should be measured while you are seated with your arm held at the level of the heart. It should be measured at least twice. A one-time elevated blood pressure reading (especially in the Emergency Department) does not mean that you need treatment. There may be conditions in which the blood pressure is different between your right and left arms. It is important to see your caregiver soon for a recheck. Most people have essential hypertension which means that there is not a specific cause. This type of high blood pressure may be lowered by changing lifestyle factors such as:  Stress.   Smoking.   Lack of exercise.   Excessive weight.   Drug/tobacco/alcohol use.   Eating less salt.  Most people do not have symptoms from high blood pressure until it has caused damage to the body. Effective treatment can often prevent, delay or reduce that damage. TREATMENT  When a cause has been identified, treatment for high blood pressure is directed at the cause. There are a large number of medications to treat HTN. These fall into several categories, and your caregiver will help you select the medicines that are best for you. Medications may have side effects. You should review side effects with your  caregiver. If your blood pressure stays high after you have made lifestyle changes or started on medicines,   Your medication(s) may need to be changed.   Other problems may need to be addressed.   Be certain you understand your prescriptions, and know how and when to take your medicine.   Be sure to follow up with your caregiver within the time frame advised (usually within two weeks) to have your blood pressure rechecked and to review your medications.   If you are taking more than one medicine to lower your blood pressure, make sure you know how and at what times they should be taken. Taking two medicines at the same time can result in blood pressure that is too low.  SEEK IMMEDIATE MEDICAL CARE IF:  You develop a severe headache, blurred or changing vision, or confusion.   You have unusual weakness or numbness, or a faint feeling.   You have severe chest or abdominal pain, vomiting, or breathing problems.  MAKE SURE YOU:   Understand these instructions.   Will watch your condition.   Will get help right away if you are not doing well or get worse.  Document Released: 06/16/2005 Document Revised: 02/26/2011 Document Reviewed: 02/04/2008 [facility] Patient Information 2012 [place], [place].

## 2011-08-22 ENCOUNTER — Encounter: Payer: Self-pay | Admitting: *Deleted

## 2011-08-27 ENCOUNTER — Telehealth: Payer: Self-pay | Admitting: *Deleted

## 2011-08-27 MED ORDER — AMOXICILLIN 875 MG PO TABS: 875.0000 mg | ORAL_TABLET | Freq: Two times a day (BID) | ORAL | Status: DC

## 2011-08-27 NOTE — Telephone Encounter (Signed)
Pt returned stating she is no better so Dr. Judie Petit advised Amox 875 bid x 10 days and call if no better.

## 2011-08-28 ENCOUNTER — Encounter: Payer: Self-pay | Admitting: Family Medicine

## 2011-08-28 LAB — CBC WITH DIFFERENTIAL/PLATELET
Basophils Relative: 1 % (ref 0–1)
Eosinophils Relative: 4 % (ref 0–5)
Hemoglobin: 12.5 g/dL (ref 12.0–15.0)
Monocytes Absolute: 0.4 10*3/uL (ref 0.1–1.0)
Monocytes Relative: 6 % (ref 3–12)
Neutro Abs: 3.4 10*3/uL (ref 1.7–7.7)
RBC: 4.48 MIL/uL (ref 3.87–5.11)
WBC: 6.8 10*3/uL (ref 4.0–10.5)

## 2011-08-28 LAB — COMPREHENSIVE METABOLIC PANEL
ALT: 14 U/L (ref 0–35)
AST: 17 U/L (ref 0–37)
Albumin: 4.3 g/dL (ref 3.5–5.2)
CO2: 22 mEq/L (ref 19–32)
Calcium: 9.6 mg/dL (ref 8.4–10.5)
Chloride: 104 mEq/L (ref 96–112)
Potassium: 4 mEq/L (ref 3.5–5.3)
Sodium: 142 mEq/L (ref 135–145)
Total Protein: 7 g/dL (ref 6.0–8.3)

## 2011-08-28 LAB — LIPID PANEL
LDL Cholesterol: 159 mg/dL — ABNORMAL HIGH (ref 0–99)
Total CHOL/HDL Ratio: 4.1 Ratio
Triglycerides: 62 mg/dL (ref <150)

## 2011-08-28 LAB — TSH: TSH: 1.222 u[IU]/mL (ref 0.350–4.500)

## 2011-10-28 ENCOUNTER — Telehealth: Payer: Self-pay | Admitting: *Deleted

## 2011-10-28 NOTE — Telephone Encounter (Signed)
I think that this is a problem I need to see on Thursday or Jade on Wednesday to ascertain what we really need to do with this problem.

## 2011-10-28 NOTE — Telephone Encounter (Signed)
Pt states that she has had another allergic reaction to hair dye. States that she went to [facility] in 03/2011 and they gave her Cipro but it didn't help and that they gave her another med but she doesn't remember the name of it. Pt would like to know if you can call her in a med for the reaction. States that her neck has swollen and she has a rash on her scalp. Please advise.

## 2011-10-29 NOTE — Telephone Encounter (Signed)
appt scheduled for pt.  

## 2011-10-30 ENCOUNTER — Ambulatory Visit: Payer: 59 | Admitting: Family Medicine

## 2012-02-18 ENCOUNTER — Ambulatory Visit (INDEPENDENT_AMBULATORY_CARE_PROVIDER_SITE_OTHER): Payer: 59 | Admitting: Obstetrics & Gynecology

## 2012-02-18 ENCOUNTER — Encounter: Payer: Self-pay | Admitting: Obstetrics & Gynecology

## 2012-02-18 VITALS — BP 142/92 | HR 86 | Temp 98.0°F | Resp 16 | Ht 66.0 in | Wt 214.0 lb

## 2012-02-18 DIAGNOSIS — N950 Postmenopausal bleeding: Secondary | ICD-10-CM

## 2012-02-18 DIAGNOSIS — Z124 Encounter for screening for malignant neoplasm of cervix: Secondary | ICD-10-CM

## 2012-02-18 DIAGNOSIS — Z01419 Encounter for gynecological examination (general) (routine) without abnormal findings: Secondary | ICD-10-CM

## 2012-02-18 DIAGNOSIS — E119 Type 2 diabetes mellitus without complications: Secondary | ICD-10-CM

## 2012-02-18 DIAGNOSIS — Z1151 Encounter for screening for human papillomavirus (HPV): Secondary | ICD-10-CM

## 2012-02-18 HISTORY — DX: Type 2 diabetes mellitus without complications: E11.9

## 2012-02-18 NOTE — Progress Notes (Addendum)
  Subjective:    Marissa Ferrell is a 51 y.o. female who presents for an annual exam. The patient has complaints of occasional itching after urination.  It is not occurring now.  The pt denies vaginal discharge.  Pt went without a period for 1 year then had a heavy bleed in July.  Pt thought she was in menopause. The patient is sexually active. GYN screening history: last pap: was normal. The patient wears seatbelts: yes. The patient participates in regular exercise: not asked. Has the patient ever been transfused or tattooed?: no. The patient reports that there is not domestic violence in her life.   Menstrual History: OB History    Grav Para Term Preterm Abortions TAB SAB Ect Mult Living   4 3 3  1  1   3        Patient's last menstrual period was 01/27/2012.    The following portions of the patient's history were reviewed and updated as appropriate: allergies, current medications, past family history, past medical history, past social history, past surgical history and problem list.  Review of Systems Pertinent items are noted in HPI.    Objective:    Vitals:  WNL General appearance: alert, cooperative and no distress Head: Normocephalic, without obvious abnormality, atraumatic Eyes: negative Throat: lips, mucosa, and tongue normal; teeth and gums normal Lungs: clear to auscultation bilaterally Breasts: normal appearance, no masses or tenderness, No nipple retraction or dimpling, No nipple discharge or bleeding Heart: regular rate and rhythm Abdomen: soft, non-tender; bowel sounds normal; no masses,  no organomegaly Pelvic: cervix normal in appearance, external genitalia normal, no adnexal masses or tenderness, no bladder tenderness, no cervical motion tenderness, perianal skin: no external genital warts noted, rectovaginal septum normal, urethra without abnormality or discharge, uterus normal size, shape, and consistency (exam difficult due to habitus) and vagina normal without  discharge Extremities: no edema, redness or tenderness in the calves or thighs Skin: no lesions or rash, + hirsutism on chest and abdomen Lymph nodes: Axillary adenopathy: none   .    Assessment:    Healthy female exam.    Plan:     All questions answered. Follow up in 1 year. Mammogram. Pelvic ultrasound. Thin prep Pap smear.  Check FSH Pelvic and TV US to evaluate lining of uterus.  If >42mm will proceed with biopsy.

## 2012-02-18 NOTE — Patient Instructions (Signed)
  Place postmenopausal annual exam patient instructions here.  

## 2012-02-19 LAB — FOLLICLE STIMULATING HORMONE: FSH: 68.9 m[IU]/mL

## 2012-02-20 ENCOUNTER — Ambulatory Visit (HOSPITAL_BASED_OUTPATIENT_CLINIC_OR_DEPARTMENT_OTHER)
Admission: RE | Admit: 2012-02-20 | Discharge: 2012-02-20 | Disposition: A | Discharge status: Home / Self Care | Payer: 59 | Source: Ambulatory Visit | Attending: Obstetrics & Gynecology | Admitting: Obstetrics & Gynecology

## 2012-02-20 DIAGNOSIS — N950 Postmenopausal bleeding: Secondary | ICD-10-CM

## 2012-02-20 DIAGNOSIS — D251 Intramural leiomyoma of uterus: Secondary | ICD-10-CM | POA: Insufficient documentation

## 2012-03-02 ENCOUNTER — Encounter: Payer: Self-pay | Admitting: Obstetrics & Gynecology

## 2012-03-02 DIAGNOSIS — R87810 Cervical high risk human papillomavirus (HPV) DNA test positive: Secondary | ICD-10-CM | POA: Insufficient documentation

## 2012-03-03 ENCOUNTER — Encounter: Payer: Self-pay | Admitting: *Deleted

## 2012-03-03 ENCOUNTER — Telehealth: Payer: Self-pay | Admitting: *Deleted

## 2012-03-03 NOTE — Telephone Encounter (Signed)
Letter sent to pt that her pap was normal but she was positive for the HPV and recommend repeat pap in one year.  Information about HPV also sent.

## 2012-07-09 ENCOUNTER — Ambulatory Visit (INDEPENDENT_AMBULATORY_CARE_PROVIDER_SITE_OTHER): Payer: 59 | Admitting: Physician Assistant

## 2012-07-09 ENCOUNTER — Encounter: Payer: Self-pay | Admitting: Physician Assistant

## 2012-07-09 VITALS — BP 140/86 | HR 85 | Ht 66.0 in | Wt 221.0 lb

## 2012-07-09 DIAGNOSIS — Z01419 Encounter for gynecological examination (general) (routine) without abnormal findings: Secondary | ICD-10-CM

## 2012-07-09 DIAGNOSIS — E785 Hyperlipidemia, unspecified: Secondary | ICD-10-CM

## 2012-07-09 DIAGNOSIS — Z131 Encounter for screening for diabetes mellitus: Secondary | ICD-10-CM

## 2012-07-09 DIAGNOSIS — Z0000 Encounter for general adult medical examination without abnormal findings: Secondary | ICD-10-CM

## 2012-07-09 DIAGNOSIS — Z1239 Encounter for other screening for malignant neoplasm of breast: Secondary | ICD-10-CM

## 2012-07-09 MED ORDER — LISINOPRIL-HYDROCHLOROTHIAZIDE 20-25 MG PO TABS: 1.0000 | ORAL_TABLET | Freq: Every day | ORAL | Status: DC

## 2012-07-09 MED ORDER — VERAPAMIL HCL ER 240 MG PO TBCR: 240.0000 mg | EXTENDED_RELEASE_TABLET | Freq: Every day | ORAL | Status: DC

## 2012-07-09 NOTE — Progress Notes (Signed)
  Subjective:     Marissa Ferrell is a 52 y.o. female and is here for a comprehensive physical exam. The patient reports no problems.    History   Social History  . Marital Status: Single    Spouse Name: N/A    Number of Children: N/A  . Years of Education: N/A   Occupational History  . Not on file.   Social History Main Topics  . Smoking status: Never Smoker   . Smokeless tobacco: Never Used  . Alcohol Use: 1.0 oz/week    2 drink(s) per week     Comment: per week  . Drug Use: No  . 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   Other Topics Concern  . Not on file   Social History Narrative  . No narrative on file   Health Maintenance  Topic Date Due  . Influenza Vaccine  03/20/2012  . Pap Smear  02/18/2015  . Tetanus/tdap  02/17/2021    The following portions of the patient's history were reviewed and updated as appropriate: allergies, current medications, past family history, past medical history, past social history, past surgical history and problem list.  Review of Systems A comprehensive review of systems was negative.   Objective:    BP 140/86  Pulse 85  Ht 5\' 6"  (1.676 m)  Wt 221 lb (100.245 kg)  BMI 35.67 kg/m2 General appearance: alert, cooperative, appears stated age and mildly obese Head: Normocephalic, without obvious abnormality, atraumatic Eyes: conjunctivae/corneas clear. PERRL, EOM's intact. Fundi benign. Ears: normal TM's and external ear canals both ears Nose: Nares normal. Septum midline. Mucosa normal. No drainage or sinus tenderness. Throat: lips, mucosa, and tongue normal; teeth and gums normal Neck: no adenopathy, no carotid bruit, no JVD, supple, symmetrical, trachea midline and thyroid not enlarged, symmetric, no tenderness/mass/nodules Back: symmetric, no curvature. ROM normal. No CVA tenderness. Lungs: clear to auscultation bilaterally Heart: regular  rate and rhythm, S1, S2 normal, no murmur, click, rub or gallop Abdomen: soft, non-tender; bowel sounds normal; no masses,  no organomegaly Extremities: extremities normal, atraumatic, no cyanosis or edema Pulses: 2+ and symmetric Skin: Skin color, texture, turgor normal. No rashes or lesions Lymph nodes: Cervical, supraclavicular, and axillary nodes normal. Neurologic: Grossly normal    Assessment:    Healthy female exam.      Plan:    CPE- declined flu shot. All other vaccines up to date. Lab slip given for fasting labs. Pt has had increased LDL. She will not start statin. Started diet and exercise. Will recheck labs today. Reminded to step up diet and exercise. Goal weight loss in 6 months is 20lbs since pt will not start statin. Discussed Welchol to try if LDL still elevated. Pt will considering after labs received. Encouraged to take calicum daily. Pap up to date. Will release order for mammogram.  HTN- BP elevated. Pt has not been taking lisinopril/HCTZ because she ran out. Start med and monitor BP to make sure coming back down. Encouraged pt to lose weight and would decrease BP.  See After Visit Summary for Counseling Recommendations

## 2012-07-09 NOTE — Patient Instructions (Addendum)
Reminder Diet and weight loss. Goal is 150 minutes a week of cardio. Goal weight loss is 20 lbs in 6 months. Reminder for calcium and vitamin D daily. Will call with labs.   Medication I am considering is Welchol if you would like to research before getting your results.   Will send for mammogram.   Fat and Cholesterol Control Diet Cholesterol levels in your body are determined significantly by your diet. Cholesterol levels may also be related to heart disease. The following material helps to explain this relationship and discusses what you can do to help keep your heart healthy. Not all cholesterol is bad. Low-density lipoprotein (LDL) cholesterol is the "bad" cholesterol. It may cause fatty deposits to build up inside your arteries. High-density lipoprotein (HDL) cholesterol is "good." It helps to remove the "bad" LDL cholesterol from your blood. Cholesterol is a very important risk factor for heart disease. Other risk factors are high blood pressure, smoking, stress, heredity, and weight. The heart muscle gets its supply of blood through the coronary arteries. If your LDL cholesterol is high and your HDL cholesterol is low, you are at risk for having fatty deposits build up in your coronary arteries. This leaves less room through which blood can flow. Without sufficient blood and oxygen, the heart muscle cannot function properly and you may feel chest pains (angina pectoris). When a coronary artery closes up entirely, a part of the heart muscle may die causing a heart attack (myocardial infarction). CHECKING CHOLESTEROL When your caregiver sends your blood to a lab to be examined for cholesterol, a complete lipid (fat) profile may be done. With this test, the total amount of cholesterol and levels of LDL and HDL are determined. Triglycerides are a type of fat that circulates in the blood. They can also be used to determine heart disease risk. The list below describes what the numbers should be: Test:  Total Cholesterol.  Less than 200 mg/dl. Test: LDL "bad cholesterol."  Less than 100 mg/dl.  Less than 70 mg/dl if you are at very high risk of a heart attack or sudden cardiac death. Test: HDL "good cholesterol."  Greater than 50 mg/dl for women.  Greater than 40 mg/dl for men. Test: Triglycerides.  Less than 150 mg/dl. CONTROLLING CHOLESTEROL WITH DIET Although exercise and lifestyle factors are important, your diet is key. That is because certain foods are known to raise cholesterol and others to lower it. The goal is to balance foods for their effect on cholesterol and more importantly, to replace saturated and trans fat with other types of fat, such as monounsaturated fat, polyunsaturated fat, and omega-3 fatty acids. On average, a person should consume no more than 15 to 17 g of saturated fat daily. Saturated and trans fats are considered "bad" fats, and they will raise LDL cholesterol. Saturated fats are primarily found in animal products such as meats, butter, and cream. However, that does not mean you need to give up all your favorite foods. Today, there are good tasting, low-fat, low-cholesterol substitutes for most of the things you like to eat. Choose low-fat or nonfat alternatives. Choose round or loin cuts of red meat. These types of cuts are lowest in fat and cholesterol. Chicken (without the skin), fish, veal, and ground [place] breast are great choices. Eliminate fatty meats, such as hot dogs and salami. Even shellfish have little or no saturated fat. Have a 3 oz (85 g) portion when you eat lean meat, poultry, or fish. Trans fats are also  called "partially hydrogenated oils." They are oils that have been scientifically manipulated so that they are solid at room temperature resulting in a longer shelf life and improved taste and texture of foods in which they are added. Trans fats are found in stick margarine, some tub margarines, cookies, crackers, and baked goods.  When baking  and cooking, oils are a great substitute for butter. The monounsaturated oils are especially beneficial since it is believed they lower LDL and raise HDL. The oils you should avoid entirely are saturated tropical oils, such as coconut and palm.  Remember to eat a lot from food groups that are naturally free of saturated and trans fat, including fish, fruit, vegetables, beans, grains (barley, rice, couscous, bulgur wheat), and pasta (without cream sauces).  IDENTIFYING FOODS THAT LOWER CHOLESTEROL  Soluble fiber may lower your cholesterol. This type of fiber is found in fruits such as apples, vegetables such as broccoli, potatoes, and carrots, legumes such as beans, peas, and lentils, and grains such as barley. Foods fortified with plant sterols (phytosterol) may also lower cholesterol. You should eat at least 2 g per day of these foods for a cholesterol lowering effect.  Read package labels to identify low-saturated fats, trans fat free, and low-fat foods at the supermarket. Select cheeses that have only 2 to 3 g saturated fat per ounce. Use a heart-healthy tub margarine that is free of trans fats or partially hydrogenated oil. When buying baked goods (cookies, crackers), avoid partially hydrogenated oils. Breads and muffins should be made from whole grains (whole-wheat or whole oat flour, instead of "flour" or "enriched flour"). Buy non-creamy canned soups with reduced salt and no added fats.  FOOD PREPARATION TECHNIQUES  Never deep-fry. If you must fry, either stir-fry, which uses very little fat, or use non-stick cooking sprays. When possible, broil, bake, or roast meats, and steam vegetables. Instead of putting butter or margarine on vegetables, use lemon and herbs, applesauce, and cinnamon (for squash and sweet potatoes), nonfat yogurt, salsa, and low-fat dressings for salads.  LOW-SATURATED FAT / LOW-FAT FOOD SUBSTITUTES Meats / Saturated Fat (g)  Avoid: Steak, marbled (3 oz/85 g) / 11 g  Choose:  Steak, lean (3 oz/85 g) / 4 g  Avoid: Hamburger (3 oz/85 g) / 7 g  Choose: Hamburger, lean (3 oz/85 g) / 5 g  Avoid: Ham (3 oz/85 g) / 6 g  Choose: Ham, lean cut (3 oz/85 g) / 2.4 g  Avoid: Chicken, with skin, dark meat (3 oz/85 g) / 4 g  Choose: Chicken, skin removed, dark meat (3 oz/85 g) / 2 g  Avoid: Chicken, with skin, light meat (3 oz/85 g) / 2.5 g  Choose: Chicken, skin removed, light meat (3 oz/85 g) / 1 g Dairy / Saturated Fat (g)  Avoid: Whole milk (1 cup) / 5 g  Choose: Low-fat milk, 2% (1 cup) / 3 g  Choose: Low-fat milk, 1% (1 cup) / 1.5 g  Choose: Skim milk (1 cup) / 0.3 g  Avoid: Hard cheese (1 oz/28 g) / 6 g  Choose: Skim milk cheese (1 oz/28 g) / 2 to 3 g  Avoid: Cottage cheese, 4% fat (1 cup) / 6.5 g  Choose: Low-fat cottage cheese, 1% fat (1 cup) / 1.5 g  Avoid: Ice cream (1 cup) / 9 g  Choose: Sherbet (1 cup) / 2.5 g  Choose: Nonfat frozen yogurt (1 cup) / 0.3 g  Choose: Frozen fruit bar / trace  Avoid: Whipped cream (1  tbs) / 3.5 g  Choose: Nondairy whipped topping (1 tbs) / 1 g Condiments / Saturated Fat (g)  Avoid: Mayonnaise (1 tbs) / 2 g  Choose: Low-fat mayonnaise (1 tbs) / 1 g  Avoid: Butter (1 tbs) / 7 g  Choose: Extra light margarine (1 tbs) / 1 g  Avoid: Coconut oil (1 tbs) / 11.8 g  Choose: Olive oil (1 tbs) / 1.8 g  Choose: Corn oil (1 tbs) / 1.7 g  Choose: Safflower oil (1 tbs) / 1.2 g  Choose: Sunflower oil (1 tbs) / 1.4 g  Choose: Soybean oil (1 tbs) / 2.4 g  Choose: Canola oil (1 tbs) / 1 g Document Released: 06/16/2005 Document Revised: 09/08/2011 Document Reviewed: 12/05/2010 [facility] Patient Information 2013 [place], [place].

## 2012-07-14 ENCOUNTER — Encounter: Payer: Self-pay | Admitting: *Deleted

## 2012-07-23 ENCOUNTER — Telehealth: Payer: Self-pay | Admitting: Physician Assistant

## 2012-07-23 DIAGNOSIS — R7301 Impaired fasting glucose: Secondary | ICD-10-CM

## 2012-07-23 NOTE — Telephone Encounter (Signed)
Lab order sent to lab. Encourage pt to think about something to help cholesterol decrease to prevent heart attack and stroke.

## 2012-08-06 ENCOUNTER — Other Ambulatory Visit: Payer: Self-pay | Admitting: Physician Assistant

## 2012-08-06 MED ORDER — NIACIN ER (ANTIHYPERLIPIDEMIC) 500 MG PO TBCR: EXTENDED_RELEASE_TABLET | ORAL | Status: DC

## 2012-09-01 ENCOUNTER — Ambulatory Visit (INDEPENDENT_AMBULATORY_CARE_PROVIDER_SITE_OTHER): Payer: BC Managed Care – PPO | Admitting: Physician Assistant

## 2012-09-01 ENCOUNTER — Encounter: Payer: Self-pay | Admitting: Physician Assistant

## 2012-09-01 VITALS — BP 159/94 | HR 86 | Wt 215.0 lb

## 2012-09-01 DIAGNOSIS — R7303 Prediabetes: Secondary | ICD-10-CM

## 2012-09-01 DIAGNOSIS — R059 Cough, unspecified: Secondary | ICD-10-CM

## 2012-09-01 DIAGNOSIS — R05 Cough: Secondary | ICD-10-CM

## 2012-09-01 DIAGNOSIS — R7301 Impaired fasting glucose: Secondary | ICD-10-CM

## 2012-09-01 DIAGNOSIS — S46912A Strain of unspecified muscle, fascia and tendon at shoulder and upper arm level, left arm, initial encounter: Secondary | ICD-10-CM

## 2012-09-01 DIAGNOSIS — R7309 Other abnormal glucose: Secondary | ICD-10-CM

## 2012-09-01 DIAGNOSIS — J019 Acute sinusitis, unspecified: Secondary | ICD-10-CM

## 2012-09-01 MED ORDER — CYCLOBENZAPRINE HCL 5 MG PO TABS: ORAL_TABLET | ORAL | Status: DC

## 2012-09-01 MED ORDER — AMOXICILLIN-POT CLAVULANATE 875-125 MG PO TABS: 1.0000 | ORAL_TABLET | Freq: Two times a day (BID) | ORAL | Status: DC

## 2012-09-01 NOTE — Patient Instructions (Addendum)
Honey/Delsym for cough. Start augmentin. Consider anti-histamine daily to help with cough.  1800 Calorie Diet for Diabetes Meal Planning The 1800 calorie diet is designed for eating up to 1800 calories each day. Following this diet and making healthy meal choices can help improve overall health. This diet controls blood sugar (glucose) levels and can also help lower blood pressure and cholesterol. SERVING SIZES Measuring foods and serving sizes helps to make sure you are getting the right amount of food. The list below tells how big or small some common serving sizes are:  1 oz.........4 stacked dice.  3 oz........Marland KitchenDeck of cards.  1 tsp.......Marland KitchenTip of little finger.  1 tbs......Marland KitchenMarland KitchenThumb.  2 tbs.......Marland KitchenGolf ball.   cup......Marland KitchenHalf of a fist.  1 cup.......Marland KitchenA fist. GUIDELINES FOR CHOOSING FOODS The goal of this diet is to eat a variety of foods and limit calories to 1800 each day. This can be done by choosing foods that are low in calories and fat. The diet also suggests eating small amounts of food frequently. Doing this helps control your blood glucose levels so they do not get too high or too low. Each meal or snack may include a protein food source to help you feel more satisfied and to stabilize your blood glucose. Try to eat about the same amount of food around the same time each day. This includes weekend days, travel days, and days off work. Space your meals about 4 to 5 hours apart and add a snack between them if you wish.  For example, a daily food plan could include breakfast, a morning snack, lunch, dinner, and an evening snack. Healthy meals and snacks include whole grains, vegetables, fruits, lean meats, poultry, fish, and dairy products. As you plan your meals, select a variety of foods. Choose from the bread and starch, vegetable, fruit, dairy, and meat/protein groups. Examples of foods from each group and their suggested serving sizes are listed below. Use measuring cups and spoons  to become familiar with what a healthy portion looks like. Bread and Starch Each serving equals 15 grams of carbohydrates.  1 slice bread.   bagel.   cup cold cereal (unsweetened).   cup hot cereal or mashed potatoes.  1 small potato (size of a computer mouse).   cup cooked pasta or rice.   English muffin.  1 cup broth-based soup.  3 cups of popcorn.  4 to 6 whole-wheat crackers.   cup cooked beans, peas, or corn. Vegetable Each serving equals 5 grams of carbohydrates.   cup cooked vegetables.  1 cup raw vegetables.   cup tomato or vegetable juice. Fruit Each serving equals 15 grams of carbohydrates.  1 small apple or orange.  1 cup watermelon or strawberries.   cup applesauce (no sugar added).  2 tbs raisins.   banana.   cup canned fruit, packed in water, its own juice, or sweetened with a sugar substitute.   cup unsweetened fruit juice. Dairy Each serving equals 12 to 15 grams of carbohydrates.  1 cup fat-free milk.  6 oz artificially sweetened yogurt or plain yogurt.  1 cup low-fat buttermilk.  1 cup soy milk.  1 cup almond milk. Meat/Protein  1 large egg.  2 to 3 oz meat, poultry, or fish.   cup low-fat cottage cheese.  1 tbs peanut butter.  1 oz low-fat cheese.   cup tuna in water.   cup tofu. Fat  1 tsp oil.  1 tsp trans-fat-free margarine.  1 tsp butter.  1 tsp mayonnaise.  2 tbs avocado.  1 tbs salad dressing.  1 tbs cream cheese.  2 tbs sour cream. SAMPLE 1800 CALORIE DIET PLAN Breakfast   cup unsweetened cereal (1 carb serving).  1 cup fat-free milk (1 carb serving).  1 slice whole-wheat toast (1 carb serving).   small banana (1 carb serving).  1 scrambled egg.  1 tsp trans-fat-free margarine. Lunch  Tuna sandwich.  2 slices whole-wheat bread (2 carb servings).   cup canned tuna in water, drained.  1 tbs reduced fat mayonnaise.  1 stalk celery, chopped.  2 slices  tomato.  1 lettuce leaf.  1 cup carrot sticks.  24 to 30 seedless grapes (2 carb servings).  6 oz light yogurt (1 carb serving). Afternoon Snack  3 graham cracker squares (1 carb serving).  Fat-free milk, 1 cup (1 carb serving).  1 tbs peanut butter. Dinner  3 oz salmon, broiled with 1 tsp oil.  1 cup mashed potatoes (2 carb servings) with 1 tsp trans-fat-free margarine.  1 cup fresh or frozen green beans.  1 cup steamed asparagus.  1 cup fat-free milk (1 carb serving). Evening Snack  3 cups air-popped popcorn (1 carb serving).  2 tbs parmesan cheese sprinkled on top. MEAL PLAN Use this worksheet to help you make a daily meal plan based on the 1800 calorie diet suggestions. If you are using this plan to help you control your blood glucose, you may interchange carbohydrate-containing foods (dairy, starches, and fruits). Select a variety of fresh foods of varying colors and flavors. The total amount of carbohydrate in your meals or snacks is more important than making sure you include all of the food groups every time you eat. Choose from the following foods to build your day's meals:  8 Starches.  4 Vegetables.  3 Fruits.  2 Dairy.  6 to 7 oz Meat/Protein.  Up to 4 Fats. Your dietician can use this worksheet to help you decide how many servings and which types of foods are right for you. BREAKFAST Food Group and Servings / Food Choice Starch ________________________________________________________ Dairy _________________________________________________________ Fruit _________________________________________________________ Meat/Protein __________________________________________________ Fat ___________________________________________________________ LUNCH Food Group and Servings / Food Choice Starch ________________________________________________________ Meat/Protein __________________________________________________ Vegetable  _____________________________________________________ Fruit _________________________________________________________ Dairy _________________________________________________________ Fat ___________________________________________________________ Marissa Ferrell Food Group and Servings / Food Choice Starch ________________________________________________________ Meat/Protein __________________________________________________ Fruit __________________________________________________________ Dairy _________________________________________________________ Marissa Ferrell Food Group and Servings / Food Choice Starch _________________________________________________________ Meat/Protein ___________________________________________________ Dairy __________________________________________________________ Vegetable ______________________________________________________ Fruit ___________________________________________________________ Fat ____________________________________________________________ Marissa Ferrell Food Group and Servings / Food Choice Fruit __________________________________________________________ Meat/Protein ___________________________________________________ Dairy __________________________________________________________ Starch _________________________________________________________ DAILY TOTALS Starch ____________________________ Vegetable _________________________ Fruit _____________________________ Dairy _____________________________ Meat/Protein______________________ Fat _______________________________ Document Released: 01/06/2005 Document Revised: 09/08/2011 Document Reviewed: 05/02/2011 [employer] Patient Information 2013 [place], [place].   Cough, Adult  A cough is a reflex that helps clear your throat and airways. It can help heal the body or may be a reaction to an irritated airway. A cough may only last 2 or 3 weeks (acute) or may last more than 8 weeks (chronic).   CAUSES Acute cough:  Viral or bacterial infections. Chronic cough:  Infections.  Allergies.  Asthma.  Post-nasal drip.  Smoking.  Heartburn or acid reflux.  Some medicines.  Chronic lung problems (COPD).  Cancer. SYMPTOMS   Cough.  Fever.  Chest pain.  Increased breathing rate.  High-pitched whistling sound when breathing (wheezing).  Colored mucus that you cough up (sputum). TREATMENT   A bacterial cough may be treated with antibiotic medicine.  A viral cough must  run its course and will not respond to antibiotics.  Your caregiver may recommend other treatments if you have a chronic cough. HOME CARE INSTRUCTIONS   Only take over-the-counter or prescription medicines for pain, discomfort, or fever as directed by your caregiver. Use cough suppressants only as directed by your caregiver.  Use a cold steam vaporizer or humidifier in your bedroom or home to help loosen secretions.  Sleep in a semi-upright position if your cough is worse at night.  Rest as needed.  Stop smoking if you smoke. SEEK IMMEDIATE MEDICAL CARE IF:   You have pus in your sputum.  Your cough starts to worsen.  You cannot control your cough with suppressants and are losing sleep.  You begin coughing up blood.  You have difficulty breathing.  You develop pain which is getting worse or is uncontrolled with medicine.  You have a fever. MAKE SURE YOU:   Understand these instructions.  Will watch your condition.  Will get help right away if you are not doing well or get worse. Document Released: 12/13/2010 Document Revised: 09/08/2011 Document Reviewed: 12/13/2010 [facility] Patient Information 2013 [place], [place]. Sinusitis Sinusitis is redness, soreness, and swelling (inflammation) of the paranasal sinuses. Paranasal sinuses are air pockets within the bones of your face (beneath the eyes, the middle of the forehead, or above the eyes). In healthy paranasal sinuses, mucus  is able to drain out, and air is able to circulate through them by way of your nose. However, when your paranasal sinuses are inflamed, mucus and air can become trapped. This can allow bacteria and other germs to grow and cause infection. Sinusitis can develop quickly and last only a short time (acute) or continue over a long period (chronic). Sinusitis that lasts for more than 12 weeks is considered chronic.  CAUSES  Causes of sinusitis include:  Allergies.  Structural abnormalities, such as displacement of the cartilage that separates your nostrils (deviated septum), which can decrease the air flow through your nose and sinuses and affect sinus drainage.  Functional abnormalities, such as when the small hairs (cilia) that line your sinuses and help remove mucus do not work properly or are not present. SYMPTOMS  Symptoms of acute and chronic sinusitis are the same. The primary symptoms are pain and pressure around the affected sinuses. Other symptoms include:  Upper toothache.  Earache.  Headache.  Bad breath.  Decreased sense of smell and taste.  A cough, which worsens when you are lying flat.  Fatigue.  Fever.  Thick drainage from your nose, which often is green and may contain pus (purulent).  Swelling and warmth over the affected sinuses. DIAGNOSIS  Your caregiver will perform a physical exam. During the exam, your caregiver may:  Look in your nose for signs of abnormal growths in your nostrils (nasal polyps).  Tap over the affected sinus to check for signs of infection.  View the inside of your sinuses (endoscopy) with a special imaging device with a light attached (endoscope), which is inserted into your sinuses. If your caregiver suspects that you have chronic sinusitis, one or more of the following tests may be recommended:  Allergy tests.  Nasal culture A sample of mucus is taken from your nose and sent to a lab and screened for bacteria.  Nasal cytology A  sample of mucus is taken from your nose and examined by your caregiver to determine if your sinusitis is related to an allergy. TREATMENT  Most cases of acute sinusitis are related to a  viral infection and will resolve on their own within 10 days. Sometimes medicines are prescribed to help relieve symptoms (pain medicine, decongestants, nasal steroid sprays, or saline sprays).  However, for sinusitis related to a bacterial infection, your caregiver will prescribe antibiotic medicines. These are medicines that will help kill the bacteria causing the infection.  Rarely, sinusitis is caused by a fungal infection. In theses cases, your caregiver will prescribe antifungal medicine. For some cases of chronic sinusitis, surgery is needed. Generally, these are cases in which sinusitis recurs more than 3 times per year, despite other treatments. HOME CARE INSTRUCTIONS   Drink plenty of water. Water helps thin the mucus so your sinuses can drain more easily.  Use a humidifier.  Inhale steam 3 to 4 times a day (for example, sit in the bathroom with the shower running).  Apply a warm, moist washcloth to your face 3 to 4 times a day, or as directed by your caregiver.  Use saline nasal sprays to help moisten and clean your sinuses.  Take over-the-counter or prescription medicines for pain, discomfort, or fever only as directed by your caregiver. SEEK IMMEDIATE MEDICAL CARE IF:  You have increasing pain or severe headaches.  You have nausea, vomiting, or drowsiness.  You have swelling around your face.  You have vision problems.  You have a stiff neck.  You have difficulty breathing. MAKE SURE YOU:   Understand these instructions.  Will watch your condition.  Will get help right away if you are not doing well or get worse. Document Released: 06/16/2005 Document Revised: 09/08/2011 Document Reviewed: 07/01/2011 [facility] Patient Information 2013 [place], [place].   Flexeril to see if can  help. Also consider ibuprofen up to 800mg  up to 3 times a day for pain.   Muscle Strain Muscle strain occurs when a muscle is stretched beyond its normal length. A small number of muscle fibers generally are torn. This is especially common in athletes. This happens when a sudden, violent force placed on a muscle stretches it too far. Usually, recovery from muscle strain takes 1 to 2 weeks. Complete healing will take 5 to 6 weeks.  HOME CARE INSTRUCTIONS   While awake, apply ice to the sore muscle for the first 2 days after the injury.  Put ice in a plastic bag.  Place a towel between your skin and the bag.  Leave the ice on for 15 to 20 minutes each hour.  Do not use the strained muscle for several days, until you no longer have pain.  You may wrap the injured area with an elastic bandage for comfort. Be careful not to wrap it too tightly. This may interfere with blood circulation or increase swelling.  Only take over-the-counter or prescription medicines for pain, discomfort, or fever as directed by your caregiver. SEEK MEDICAL CARE IF:  You have increasing pain or swelling in the injured area. MAKE SURE YOU:   Understand these instructions.  Will watch your condition.  Will get help right away if you are not doing well or get worse. Document Released: 06/16/2005 Document Revised: 09/08/2011 Document Reviewed: 06/28/2011 [facility] Patient Information 2013 [place], [place].

## 2012-09-01 NOTE — Progress Notes (Signed)
  Subjective:    Patient ID: Marissa Ferrell, female    DOB: May 10, 1961, 52 y.o.   MRN: [id number]  HPI Patient presents to the clinic with sinus pressure, ear congestion, cough for 2 weeks. She has tried decongestants and alka setzer. She sucks on halls cough drop daily. Her cough is productive but only during the day. She does not cough at night. She denies fever, chills, muscle aches all over, SOB, ST or wheezing. She does have an area on her left scapula that is very painful about 8/9 with movement but only 3/4 at rest. She denies any injury, heavy lifting. Started this am about 9 am. Nothing makes better. Movement makes worse. She has history of muscle spasms.   Elevated fasting glucose. Need follow up A1C.       Review of Systems  All other systems reviewed and are negative.       Objective:   Physical Exam  Constitutional: She is oriented to person, place, and time. She appears well-developed and well-nourished.  HENT:  Head: Normocephalic and atraumatic.  Right Ear: External ear normal.  Left Ear: External ear normal.  TM's clear. Oropharynx erythematous. Tonsils normal. Turbinates red and swollen.  Eyes: Conjunctivae are normal. Right eye exhibits no discharge.  Neck: Normal range of motion. Neck supple.  Cardiovascular: Normal rate, regular rhythm and normal heart sounds.   Pulmonary/Chest: Effort normal and breath sounds normal. She has no wheezes.  Musculoskeletal:  Normal ROM of left shoulder. Tenderness to palpation and multiple hard knots over left scapula nad all around. Strength 5/5. paraspinus muscle very tight.   Lymphadenopathy:    She has no cervical adenopathy.  Neurological: She is alert and oriented to person, place, and time.  Skin: Skin is warm and dry.  Psychiatric: She has a normal mood and affect. Her behavior is normal.          Assessment & Plan:  Sinusitis/Cough- Offered cough syrup pt declined. Start Augmentin for 10 days. Encouraged for  cough honey/delsym/umcka cold care. Call if not improving.   Muscle strain- Very tight needs muscle relaxer, ibuprofen, massage. Gave flexeril to use at bedtime and if needed throughout the day. Discussed epson salt bath. Did give some exercise to stretch out the strain. Alt ice and heat.  Gave handout if not improving or worsening call back.    Elevated blood glucose- A1C was 6.1. Explained to the patient that this is pre-diabetes. Is she makes changes now it does not have to progress. Did explain that diabetes is a progressive disease. Gave pt a diet to start making her aware of ways she can make changes. Offered referral for diabetic education. She declined today but can call back at anytime.

## 2012-09-10 ENCOUNTER — Telehealth: Payer: Self-pay | Admitting: *Deleted

## 2012-09-10 MED ORDER — PREDNISONE 20 MG PO TABS: 20.0000 mg | ORAL_TABLET | Freq: Two times a day (BID) | ORAL | Status: DC

## 2012-09-10 MED ORDER — AZITHROMYCIN 250 MG PO TABS: ORAL_TABLET | ORAL | Status: DC

## 2012-09-10 NOTE — Telephone Encounter (Signed)
Pt.notified

## 2012-09-10 NOTE — Telephone Encounter (Signed)
Pt calls today & states that the amoxicillin you gave her is not working & is asking if you can send her in a z-pack? Please advise.

## 2012-09-10 NOTE — Telephone Encounter (Signed)
I sent zpak and steroid please call if not improving.

## 2012-10-08 ENCOUNTER — Other Ambulatory Visit: Payer: Self-pay | Admitting: *Deleted

## 2012-10-08 MED ORDER — NIACIN ER (ANTIHYPERLIPIDEMIC) 500 MG PO TBCR: 500.0000 mg | EXTENDED_RELEASE_TABLET | Freq: Every day | ORAL | Status: DC

## 2012-10-12 ENCOUNTER — Telehealth: Payer: Self-pay | Admitting: *Deleted

## 2012-10-12 NOTE — Telephone Encounter (Signed)
Pharmacy calls and states they need clarification on directions of the Niaspan. Asked to send in new rx to them electronically with correct directions.

## 2012-10-13 MED ORDER — NIACIN ER (ANTIHYPERLIPIDEMIC) 500 MG PO TBCR: EXTENDED_RELEASE_TABLET | ORAL | Status: DC

## 2012-10-13 NOTE — Telephone Encounter (Signed)
Sent to pharm ....

## 2013-01-25 IMAGING — US US PELVIS COMPLETE
1 series · 13 of 25 positions shown · non-contrast
Comparison: None

CLINICAL DATA: Postmenopausal vaginal bleeding

TRANSABDOMINAL AND TRANSVAGINAL ULTRASOUND OF PELVIS
TECHNIQUE: Both transabdominal and transvaginal ultrasound
examinations of the pelvis were performed. Transabdominal technique
was performed for global imaging of the pelvis including uterus,
ovaries, adnexal regions, and pelvic cul-de-sac.
It was necessary to proceed with endovaginal exam following the
transabdominal exam to visualize the endometrium, neither ovary
seen transabdominally.

[Series 1: us pelvis complete · 0.26mm/px · 73 acquisitions, 13 frames shown]
[im 1/73]
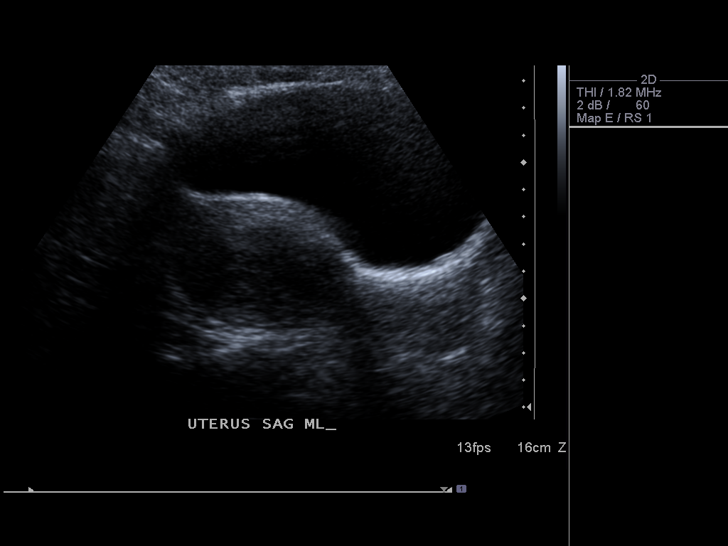
[im 7/73]
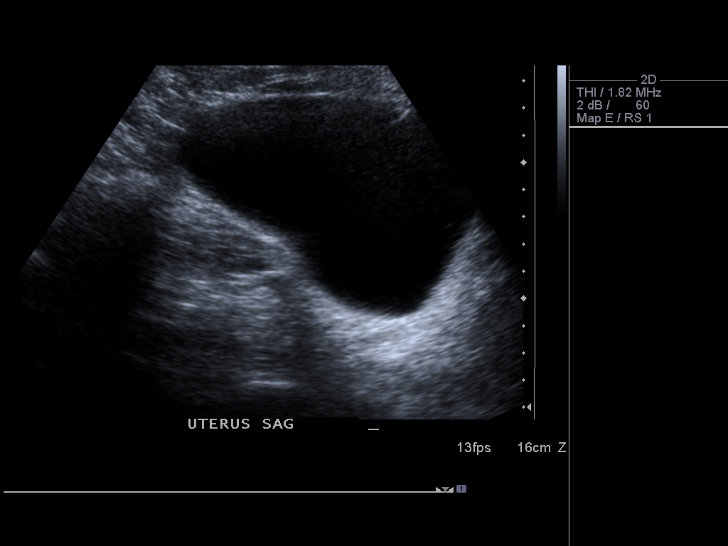
[im 13/73]
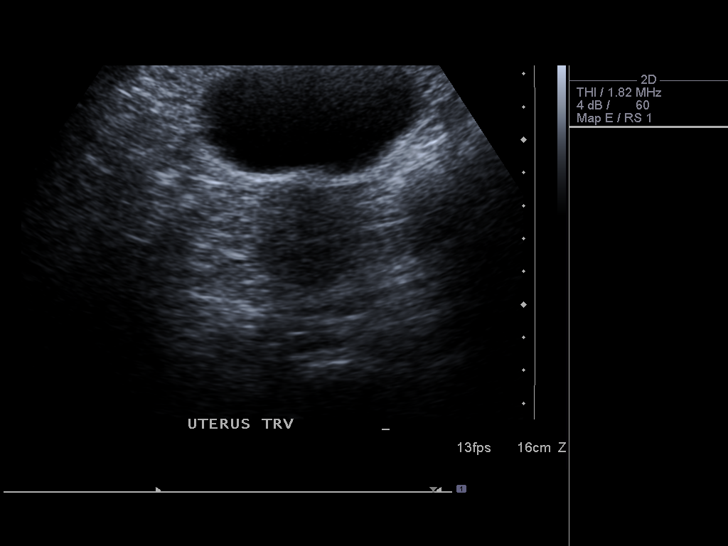
[im 19/73]
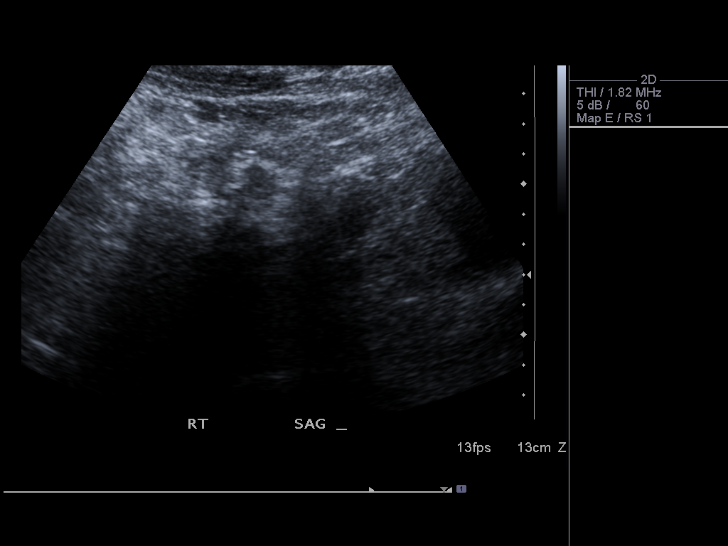
[im 25/73]
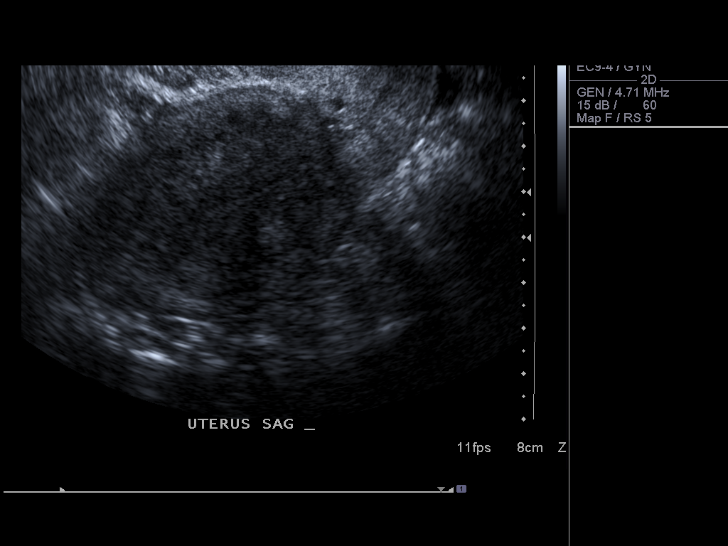
[im 31/73]
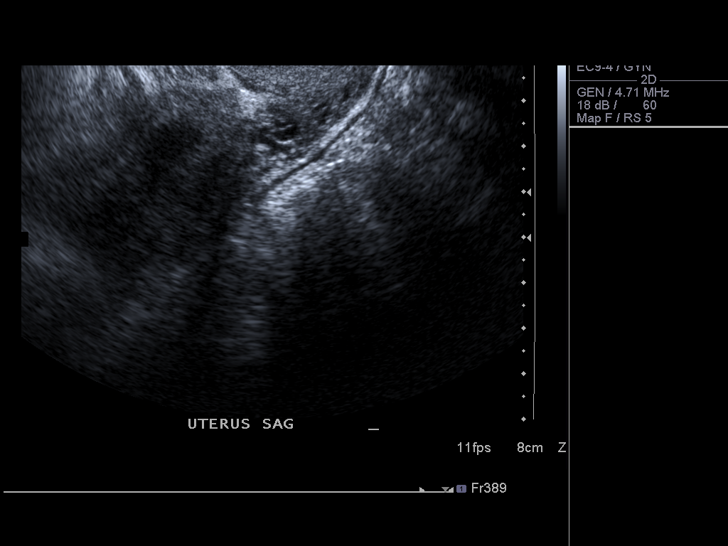
[im 37/73]
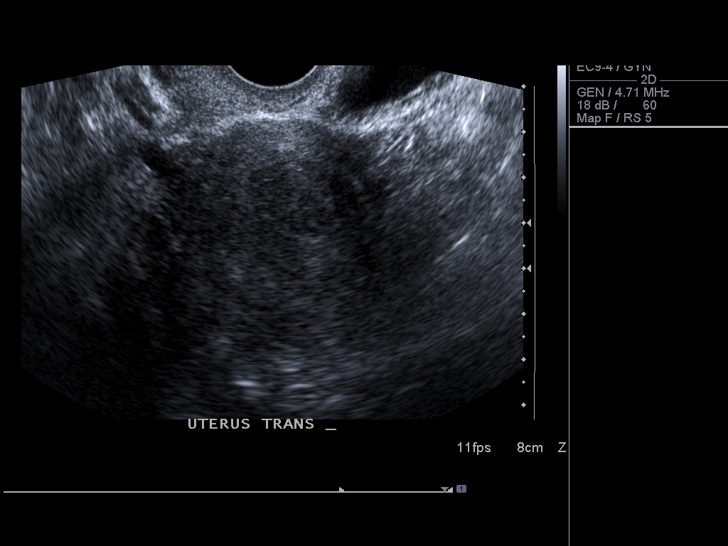
[im 43/73]
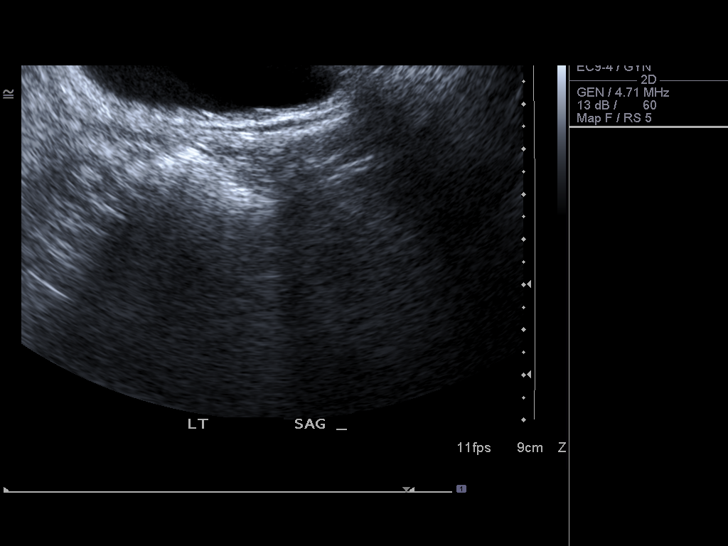
[im 49/73]
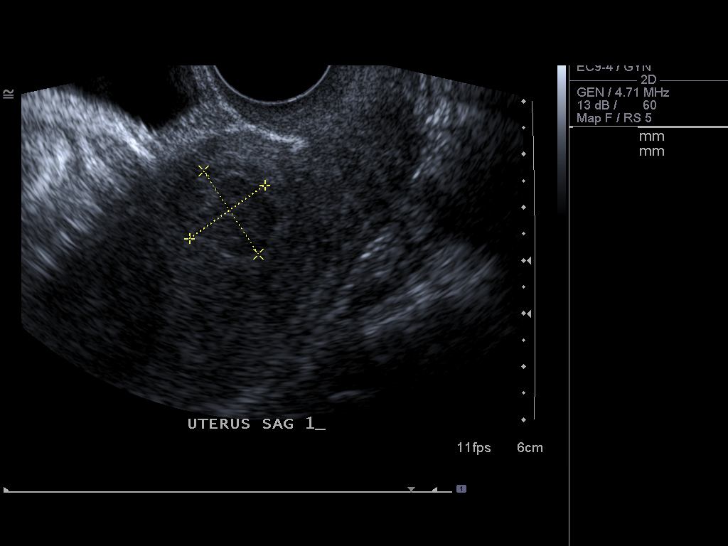
[im 55/73]
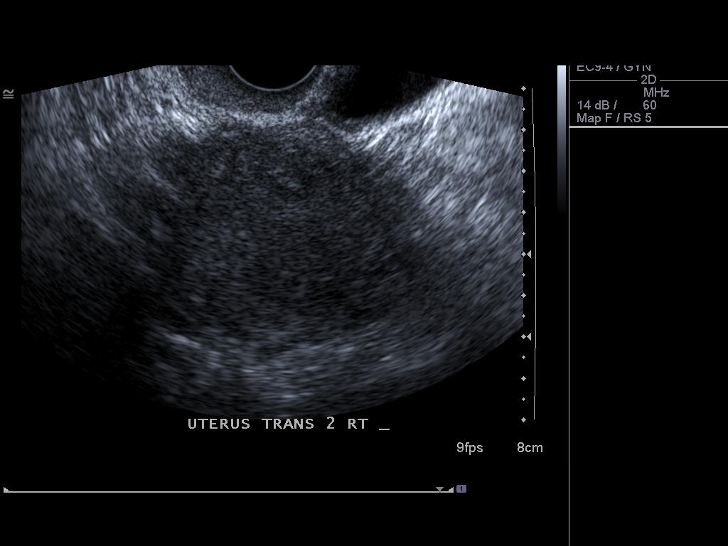
[im 61/73]
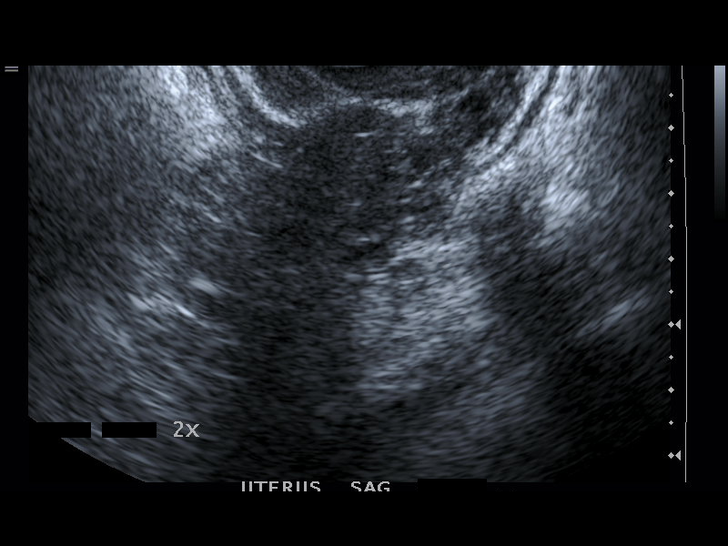
[im 67/73]
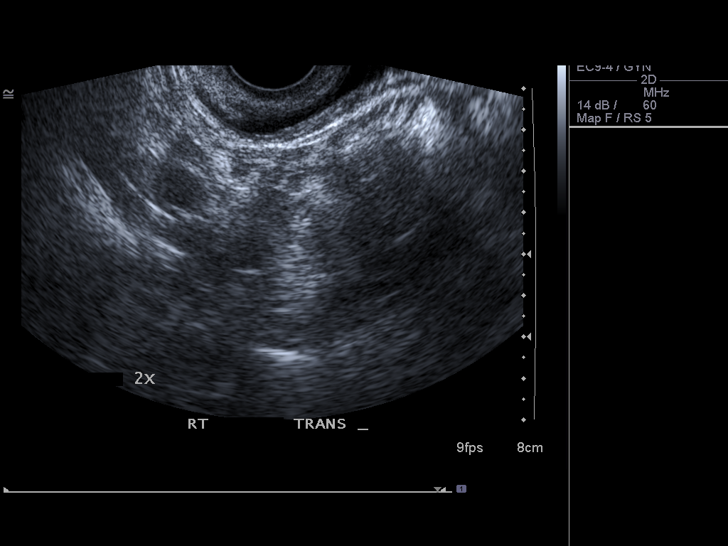
[im 73/73]
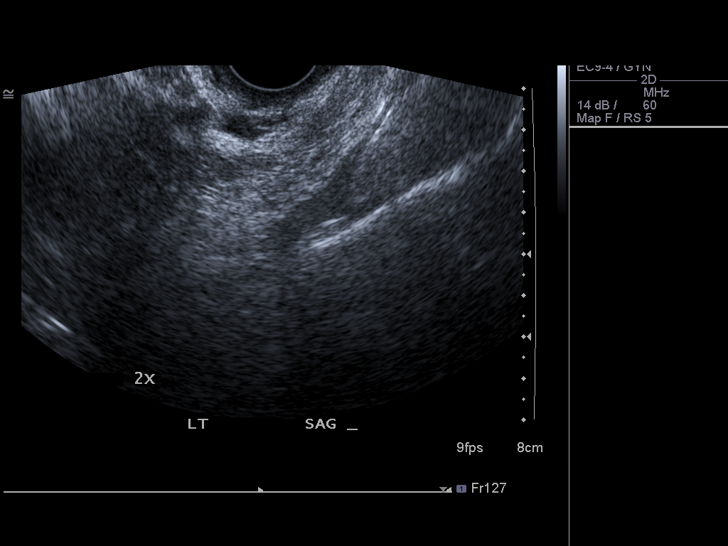

[13 of 25 positions shown; findings below may reference images not displayed]

FINDINGS: Uterus: Anteverted, anteflexed.  11.4 x 5.0 x 4.9 cm.
Inhomogeneous myometrium with areas of posterior shadowing.

Lower uterine segment anterior uterine body intramural probable
fibroid measures 1.9 x 1.7 x 1.6 cm.  Minimal if any mass effect
upon the endometrium.

Posterior uterine fundal intramural fibroid, 2.2 x 2.0 x 1.7 cm, no
apparent mass effect upon the endometrium.

Endometrium: 2 mm, uniformly thin without focal abnormality.

Right ovary:  Not visualized.  No adnexal mass.

Left ovary: Not visualized.  No adnexal mass.

Other findings: No free fluid
IMPRESSION: Inhomogeneous myometrium with suggestion of intramural underlying
fibroids with minimal if any mass effect upon the thin endometrium.
Endometrial thickness less than 4 mm may be seen with atrophy,
which may be a cause of post menopausal bleeding..

## 2013-07-12 ENCOUNTER — Ambulatory Visit (INDEPENDENT_AMBULATORY_CARE_PROVIDER_SITE_OTHER): Payer: 59 | Admitting: Physician Assistant

## 2013-07-12 ENCOUNTER — Encounter: Payer: Self-pay | Admitting: Physician Assistant

## 2013-07-12 ENCOUNTER — Other Ambulatory Visit (HOSPITAL_COMMUNITY)
Admission: RE | Admit: 2013-07-12 | Discharge: 2013-07-12 | Disposition: A | Discharge status: Home / Self Care | Payer: 59 | Source: Ambulatory Visit | Attending: Family Medicine | Admitting: Family Medicine

## 2013-07-12 VITALS — BP 148/90 | HR 84 | Wt 217.0 lb

## 2013-07-12 DIAGNOSIS — R7303 Prediabetes: Secondary | ICD-10-CM

## 2013-07-12 DIAGNOSIS — R87810 Cervical high risk human papillomavirus (HPV) DNA test positive: Secondary | ICD-10-CM | POA: Insufficient documentation

## 2013-07-12 DIAGNOSIS — Z01419 Encounter for gynecological examination (general) (routine) without abnormal findings: Secondary | ICD-10-CM

## 2013-07-12 DIAGNOSIS — I10 Essential (primary) hypertension: Secondary | ICD-10-CM

## 2013-07-12 DIAGNOSIS — Z124 Encounter for screening for malignant neoplasm of cervix: Secondary | ICD-10-CM | POA: Insufficient documentation

## 2013-07-12 DIAGNOSIS — Z1151 Encounter for screening for human papillomavirus (HPV): Secondary | ICD-10-CM | POA: Insufficient documentation

## 2013-07-12 DIAGNOSIS — E7800 Pure hypercholesterolemia, unspecified: Secondary | ICD-10-CM

## 2013-07-12 DIAGNOSIS — R7309 Other abnormal glucose: Secondary | ICD-10-CM

## 2013-07-12 DIAGNOSIS — Z1211 Encounter for screening for malignant neoplasm of colon: Secondary | ICD-10-CM

## 2013-07-12 DIAGNOSIS — N926 Irregular menstruation, unspecified: Secondary | ICD-10-CM

## 2013-07-12 DIAGNOSIS — Z0000 Encounter for general adult medical examination without abnormal findings: Secondary | ICD-10-CM

## 2013-07-12 MED ORDER — AMLODIPINE BESYLATE 5 MG PO TABS: 5.0000 mg | ORAL_TABLET | Freq: Every day | ORAL | Status: DC

## 2013-07-12 NOTE — Progress Notes (Signed)
Subjective:     Marissa Ferrell is a 53 y.o. female and is here for a2 comprehensive physical exam. The patient reports no problems.   Last period 06/20/13 but the first one in 10 months. Lasted 3 days. Sexually active with one partner.   HTN- stopped all medication. Denies any CP, palpitations, SoB, headaches. Stopped b/c didn't want to be on any meds that could cause gout or BB that makes her feel tired.    History   Social History  . Marital Status: Single    Spouse Name: N/A    Number of Children: N/A  . Years of Education: N/A   Occupational History  . Not on file.   Social History Main Topics  . Smoking status: Never Smoker   . Smokeless tobacco: Never Used  . Alcohol Use: 1.0 oz/week    2 drink(s) per week     Comment: per week  . Drug Use: No  . 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   Other Topics Concern  . Not on file   Social History Narrative  . No narrative on file   Health Maintenance  Topic Date Due  . Colonoscopy  08/03/2012  . Influenza Vaccine  01/09/2014  . Mammogram  09/28/2013  . Pap Smear  02/18/2015  . Tetanus/tdap  02/17/2021    The following portions of the patient's history were reviewed and updated as appropriate: allergies, current medications, past family history, past medical history, past social history, past surgical history and problem list.  Review of Systems A comprehensive review of systems was negative.   Objective:    BP 148/90  Pulse 84  Wt 217 lb (98.431 kg)  LMP 06/20/2013 General appearance: alert, cooperative, appears stated age and mildly obese Head: Normocephalic, without obvious abnormality, atraumatic Eyes: conjunctivae/corneas clear. PERRL, EOM's intact. Fundi benign. Ears: normal TM's and external ear canals both ears Nose: Nares normal. Septum midline. Mucosa normal. No drainage or sinus tenderness. Throat: lips, mucosa,  and tongue normal; teeth and gums normal Neck: no adenopathy, no carotid bruit, no JVD, supple, symmetrical, trachea midline and thyroid not enlarged, symmetric, no tenderness/mass/nodules Back: symmetric, no curvature. ROM normal. No CVA tenderness. Lungs: clear to auscultation bilaterally Breasts: normal appearance, no masses or tenderness Heart: regular rate and rhythm, S1, S2 normal, no murmur, click, rub or gallop Abdomen: soft, non-tender; bowel sounds normal; no masses,  no organomegaly Pelvic: external genitalia normal, no adnexal masses or tenderness, no cervical motion tenderness, uterus normal size, shape, and consistency, vagina normal without discharge and transformation zone around the cervical ox is erythematous and bled with sample. Extremities: extremities normal, atraumatic, no cyanosis or edema Pulses: 2+ and symmetric Skin: Skin color, texture, turgor normal. No rashes or lesions Lymph nodes: Cervical, supraclavicular, and axillary nodes normal. Neurologic: Grossly normal    Assessment:    Healthy female exam.      Plan:    CPE- hx of abnormal pap. Pap done today. Declined flu shot all other vaccines up to date. recommened calcium 1200mg  and vitamin D 800units daily. Fasting lab slip given to pt. Exercise 157minutes a week encouraged. 1200 calorie diet encouraged. Ordered colonoscopy. Will get mammogram when has been a year in April.   HTN- not currently on any medications. Restart norvasc 5mg  qd. Recheck in 2 weeks.   Irregular periods- will check FSH. To make sure hormone levels are trending up. Problem list  states post-menopause bleeding but I do not think pt has been period free for one year.    See After Visit Summary for Counseling Recommendations

## 2013-07-12 NOTE — Patient Instructions (Addendum)
Colonoscopy and mammogram this year.   Keeping You Healthy  Get These Tests  Blood Pressure- Have your blood pressure checked by your healthcare provider at least once a year.  Normal blood pressure is 120/80.  Weight- Have your body mass index (BMI) calculated to screen for obesity.  BMI is a measure of body fat based on height and weight.  You can calculate your own BMI at [employer]  Cholesterol- Have your cholesterol checked every year.  Diabetes- Have your blood sugar checked every year if you have high blood pressure, high cholesterol, a family history of diabetes or if you are overweight.  Pap Smear- Have a pap smear every 1 to 3 years if you have been sexually active.  If you are older than 65 and recent pap smears have been normal you may not need additional pap smears.  In addition, if you have had a hysterectomy  For benign disease additional pap smears are not necessary.  Mammogram-Yearly mammograms are essential for early detection of breast cancer  Screening for Colon Cancer- Colonoscopy starting at age 64. Screening may begin sooner depending on your family history and other health conditions.  Follow up colonoscopy as directed by your Gastroenterologist.  Screening for Osteoporosis- Screening begins at age 8 with bone density scanning, sooner if you are at higher risk for developing Osteoporosis.  Get these medicines  Calcium with Vitamin D- Your body requires 1200-1500 mg of Calcium a day and [phone] IU of Vitamin D a day.  You can only absorb 500 mg of Calcium at a time therefore Calcium must be taken in 2 or 3 separate doses throughout the day.  Hormones- Hormone therapy has been associated with increased risk for certain cancers and heart disease.  Talk to your healthcare provider about if you need relief from menopausal symptoms.  Aspirin- Ask your healthcare provider about taking Aspirin to prevent Heart Disease and Stroke.  Get these  Immuniztions  Flu shot- Every fall  Pneumonia shot- Once after the age of 47; if you are younger ask your healthcare provider if you need a pneumonia shot.  Tetanus- Every ten years.  Zostavax- Once after the age of 21 to prevent shingles.  Take these steps  Don't smoke- Your healthcare provider can help you quit. For tips on how to quit, ask your healthcare provider or go to www.smokefree.gov or call 1-800 QUIT-NOW.  Be physically active- Exercise 5 days a week for a minimum of 30 minutes.  If you are not already physically active, start slow and gradually work up to 30 minutes of moderate physical activity.  Try walking, dancing, bike riding, swimming, etc.  Eat a healthy diet- Eat a variety of healthy foods such as fruits, vegetables, whole grains, low fat milk, low fat cheeses, yogurt, lean meats, chicken, fish, eggs, dried beans, tofu, etc.  For more information go to www.thenutritionsource.org  Dental visit- Brush and floss teeth twice daily; visit your dentist twice a year.  Eye exam- Visit your Optometrist or Ophthalmologist yearly.  Drink alcohol in moderation- Limit alcohol intake to one drink or less a day.  Never drink and drive.  Depression- Your emotional health is as important as your physical health.  If you're feeling down or losing interest in things you normally enjoy, please talk to your healthcare provider.  Seat Belts- can save your life; always wear one  Smoke/Carbon Monoxide detectors- These detectors need to be installed on the appropriate level of your home.  Replace batteries at least  once a year.  Violence- If anyone is threatening or hurting you, please tell your healthcare provider.  Living Will/ Health care power of attorney- Discuss with your healthcare provider and family.

## 2013-07-20 ENCOUNTER — Encounter: Payer: Self-pay | Admitting: Physician Assistant

## 2013-07-20 DIAGNOSIS — R87618 Other abnormal cytological findings on specimens from cervix uteri: Secondary | ICD-10-CM | POA: Insufficient documentation

## 2013-07-20 DIAGNOSIS — R8789 Other abnormal findings in specimens from female genital organs: Secondary | ICD-10-CM | POA: Insufficient documentation

## 2013-09-30 LAB — BASIC METABOLIC PANEL
BUN: 15 mg/dL (ref 4–21)
CREATININE: 0.9 mg/dL (ref 0.5–1.1)
Glucose: 92 mg/dL
POTASSIUM: 3.9 mmol/L (ref 3.4–5.3)
Sodium: 142 mmol/L (ref 137–147)

## 2013-09-30 LAB — HEPATIC FUNCTION PANEL
ALT: 10 U/L (ref 7–35)
AST: 12 U/L — AB (ref 13–35)
Alkaline Phosphatase: 57 U/L (ref 25–125)
Bilirubin, Total: 0.3 mg/dL

## 2013-09-30 LAB — LIPID PANEL
CHOLESTEROL: 186 mg/dL (ref 0–200)
HDL: 48 mg/dL (ref 35–70)
LDL Cholesterol: 127 mg/dL
Triglycerides: 55 mg/dL (ref 40–160)

## 2013-09-30 LAB — FOLLICLE STIMULATING HORMONE: FSH: 73.7

## 2013-10-06 ENCOUNTER — Encounter: Payer: Self-pay | Admitting: *Deleted

## 2013-10-19 ENCOUNTER — Encounter: Payer: Self-pay | Admitting: Physician Assistant

## 2013-10-19 ENCOUNTER — Ambulatory Visit (INDEPENDENT_AMBULATORY_CARE_PROVIDER_SITE_OTHER): Payer: 59 | Admitting: Physician Assistant

## 2013-10-19 VITALS — BP 142/85 | HR 70 | Ht 66.0 in | Wt 216.0 lb

## 2013-10-19 DIAGNOSIS — Z1211 Encounter for screening for malignant neoplasm of colon: Secondary | ICD-10-CM

## 2013-10-19 DIAGNOSIS — I10 Essential (primary) hypertension: Secondary | ICD-10-CM

## 2013-10-19 MED ORDER — AMLODIPINE BESYLATE 10 MG PO TABS: 10.0000 mg | ORAL_TABLET | Freq: Every day | ORAL | Status: DC

## 2013-10-19 NOTE — Progress Notes (Signed)
   Subjective:    Patient ID: Marissa Ferrell, female    DOB: 1961-04-18, 53 y.o.   MRN: [id number]  HPI Pt presents to the clinic to follow up on BP.   HtN- not took norvasc for the last 3 days because she ran out. Reports checking blood pressure and never running below 140/90. Denies any chest pains, palpitations, headaches, vision changes. Tolerated medication well. Denies any lower leg edema.   Review of Systems     Objective:   Physical Exam  Constitutional: She is oriented to person, place, and time. She appears well-developed and well-nourished.  HENT:  Head: Normocephalic and atraumatic.  Cardiovascular: Normal rate, regular rhythm and normal heart sounds.   Pulmonary/Chest: Effort normal and breath sounds normal.  Neurological: She is alert and oriented to person, place, and time.  Psychiatric: She has a normal mood and affect. Her behavior is normal.          Assessment & Plan:  HTN- BP still not controlled however pt did not take norvasc for 3 days. Per pt BP stayed the same not below 140/90 on medication. Will increase norvasc to 10mg  daily. Continue to recheck BP. Follow up in 4-6 weeks. Reminded or low salt diet and exercise.   Pt has not heard from colonoscopy. Will refer again.

## 2013-12-05 ENCOUNTER — Ambulatory Visit (INDEPENDENT_AMBULATORY_CARE_PROVIDER_SITE_OTHER): Payer: 59 | Admitting: Physician Assistant

## 2013-12-05 ENCOUNTER — Encounter: Payer: Self-pay | Admitting: Physician Assistant

## 2013-12-05 VITALS — BP 131/83 | HR 85 | Wt 216.0 lb

## 2013-12-05 DIAGNOSIS — R600 Localized edema: Secondary | ICD-10-CM

## 2013-12-05 DIAGNOSIS — I10 Essential (primary) hypertension: Secondary | ICD-10-CM

## 2013-12-05 DIAGNOSIS — R609 Edema, unspecified: Secondary | ICD-10-CM

## 2013-12-05 MED ORDER — OLMESARTAN MEDOXOMIL 40 MG PO TABS: 40.0000 mg | ORAL_TABLET | Freq: Every day | ORAL | Status: DC

## 2013-12-05 NOTE — Progress Notes (Signed)
   Subjective:    Patient ID: Marissa Ferrell, female    DOB: 1961/04/16, 53 y.o.   MRN: [id number]  HPI Patient is a 53 year old female who presents to the clinic with bilateral ankle and feet swelling for the last 5 weeks. At last visit we increased her Norvasc to 10 mg daily. Her blood pressure is currently controlled. She denies any chest pains, palpitations, headaches or vision changes. She thought her feet were due to her job but she took a rest for one week and no improvement of swelling. She's not tried anything else to make better. She denies any feet pain. Patient denies any shortness of breath or wheezing.   Review of Systems  All other systems reviewed and are negative.      Objective:   Physical Exam  Constitutional: She is oriented to person, place, and time. She appears well-developed and well-nourished.  HENT:  Head: Normocephalic and atraumatic.  Neck: No JVD present.  Cardiovascular: Normal rate, regular rhythm and normal heart sounds.   Pulmonary/Chest: Effort normal and breath sounds normal. She has no wheezes.  Neurological: She is alert and oriented to person, place, and time.  Skin:  1+ pitting edema bilateral feet and up ankles.  Psychiatric: She has a normal mood and affect. Her behavior is normal.          Assessment & Plan:  Hypertension/bilateral ankle and feet edema-I. suspect that 5 weeks of edema is caused by increase of Norvasc. I placed Norvasc with side effects on allergy list. Norvasc stopped today.  Discussed other blood pressure options with patient. She would not like to be put on a diuretic do to possibility of gout. We opted to try Benicar 40 mg daily. Samples were given for 3 weeks. Patient will return to the clinic in one month for blood pressure recheck. If not controlled at that time we'll consider other blood pressure options. Discussed options to help swelling resolve faster. The next this she can elevate feet. Apply gentle compression with  Ace bandage. Would like to give diuretic but patient is not willing to try diuretic at this time. We'll followup in one month. If edema is worsening please call office.

## 2013-12-09 ENCOUNTER — Encounter: Payer: Self-pay | Admitting: Physician Assistant

## 2013-12-16 ENCOUNTER — Telehealth: Payer: Self-pay | Admitting: *Deleted

## 2013-12-16 NOTE — Telephone Encounter (Signed)
Pt left vm stating that the new med you started her on isn't working.  Her legs are still swollen and her bp is still 140s/80s.

## 2013-12-16 NOTE — Telephone Encounter (Signed)
LM for pt discussing need to go on BP medication with diuretic. Pt has reservation because of side effect of causing gout. She has never had gout. Encouraged ace wrap,eelevation and decrease salt intake. Follow up on Monday with phone. More than willing to call in another pill to try.

## 2013-12-19 NOTE — Telephone Encounter (Signed)
Spoke with pt.  She states that she got ur message & that she hasn't taken her med in the last 4 days & her legs already feel much better.

## 2013-12-19 NOTE — Telephone Encounter (Signed)
The benicar? How is your blood pressure?

## 2013-12-20 ENCOUNTER — Other Ambulatory Visit: Payer: Self-pay | Admitting: Physician Assistant

## 2013-12-20 NOTE — Telephone Encounter (Signed)
Pt said that she has not taken the benicar the last 4 days and she checked her bp while we were on the phone together and it was 169/100 but she had to rush to get cuff. She said that her legs looked and felt better. Please advise. Margette Fast, CMA

## 2013-12-21 NOTE — Telephone Encounter (Signed)
Left voicemail for Marissa Ferrell to return my call to discuss med change for her bp med. Margette Fast, CMA

## 2013-12-21 NOTE — Telephone Encounter (Signed)
BP is too elevated. unusal for benicar to cause swelling but certainly seems like that is the case. Would you like to pick up samples and see if we could control BP without side effects. I would like to try edarbi 40mg  daily if she would like to try. If not we need to go to a different class and try.

## 2013-12-22 NOTE — Telephone Encounter (Signed)
Heidee returned my call this morning and she is going to try the [place] 40mg . She will stop by the office this afternoon to pick up samples. Margette Fast, CMA

## 2014-01-03 ENCOUNTER — Telehealth: Payer: Self-pay | Admitting: *Deleted

## 2014-01-03 NOTE — Telephone Encounter (Signed)
Pt left vm asking about a list of GI docs that you were suppose to put together for her.  Do you remember this? I saw a referral was made.Marissa Ferrell

## 2014-01-04 NOTE — Telephone Encounter (Signed)
If she wants list we can give her a few but referral was made for [facility]. Has she been called?

## 2014-01-09 NOTE — Telephone Encounter (Signed)
Spoke with pt today, she has not been called by [facility]. I gave her their phone number so she could f/u with them since we have sent two referrals to them for her.

## 2014-01-23 ENCOUNTER — Ambulatory Visit (INDEPENDENT_AMBULATORY_CARE_PROVIDER_SITE_OTHER): Payer: 59 | Admitting: Physician Assistant

## 2014-01-23 ENCOUNTER — Encounter: Payer: Self-pay | Admitting: Physician Assistant

## 2014-01-23 VITALS — BP 150/93 | HR 86 | Ht 66.0 in | Wt 219.0 lb

## 2014-01-23 DIAGNOSIS — I10 Essential (primary) hypertension: Secondary | ICD-10-CM

## 2014-01-23 MED ORDER — AZILSARTAN MEDOXOMIL 80 MG PO TABS: ORAL_TABLET | ORAL | Status: DC

## 2014-01-23 NOTE — Progress Notes (Signed)
   Subjective:    Patient ID: Marissa Ferrell, female    DOB: 09-14-1960, 53 y.o.   MRN: [id number]  HPI Pt is a 54 yo female who comes in to recheck blood pressure after being started on edarbi 40mg  daily. She has had intolerance to norvasc and lisinopril. benicar did nothing for her BP. albe to tolerate edarbi. No CP, headaches, vision changes. BP running at home around 148-152/90-94.    Review of Systems  All other systems reviewed and are negative.      Objective:   Physical Exam  Constitutional: She is oriented to person, place, and time. She appears well-developed and well-nourished.  HENT:  Head: Normocephalic and atraumatic.  Cardiovascular: Normal rate, regular rhythm and normal heart sounds.   Pulmonary/Chest: Effort normal and breath sounds normal.  Neurological: She is alert and oriented to person, place, and time.  Skin: Skin is dry.  Psychiatric: She has a normal mood and affect. Her behavior is normal.          Assessment & Plan:  HTN- increased edarbi to 80mg  daily. Samples given. Follow up in 3 weeks with nurse visit only for BP recheck. If controlled will give rx for refill for 3 months and then follow up.

## 2014-02-20 ENCOUNTER — Telehealth: Payer: Self-pay | Admitting: Physician Assistant

## 2014-02-20 ENCOUNTER — Ambulatory Visit (INDEPENDENT_AMBULATORY_CARE_PROVIDER_SITE_OTHER): Payer: 59 | Admitting: Physician Assistant

## 2014-02-20 VITALS — BP 154/96 | HR 82

## 2014-02-20 DIAGNOSIS — I10 Essential (primary) hypertension: Secondary | ICD-10-CM

## 2014-02-20 NOTE — Progress Notes (Signed)
   Subjective:    Patient ID: Marissa Ferrell, female    DOB: [birth year], 53 y.o.   MRN: [id number]  HPI  Marissa Ferrell is here for a blood pressure check. Denies chest pain, shortness of breath or headaches.   Review of Systems     Objective:   Physical Exam        Assessment & Plan:  Pt is currently on samples of edarbi 80mg . BP is still not controlled. My nurse is calling to discuss options with pt. i would like to add diuretic to edarbi and recheck in 4 weeks. Pt is resistant to try diuretic due to fear of getting gout. If she does not want to do diuretic will have to add another BP medications. Will try BB at this time. Waiting for pt to call back. Iran Planas PA-C

## 2014-02-20 NOTE — Telephone Encounter (Signed)
BP still elevated. You did take medication this am, correct?  Options are to add small dose diuretic to current medication or to add another medication to [place]. My suggest is to try small dose diuretic to edarbi. i know pt is concerned about gout however in combination should lessen the risk. And the fact that you have never gotten gout lessens the risk even more.

## 2014-02-23 NOTE — Telephone Encounter (Signed)
Patient wants to continue taking the edarbi and recheck her blood pressure next week.

## 2014-02-23 NOTE — Telephone Encounter (Signed)
Ok for 1 week recheck but have it be appt with me so we can talk in person about her options.

## 2014-02-23 NOTE — Telephone Encounter (Signed)
Patient advised and agreed and schedule for Wednesday.

## 2014-03-01 ENCOUNTER — Encounter: Payer: Self-pay | Admitting: Physician Assistant

## 2014-03-01 ENCOUNTER — Ambulatory Visit (INDEPENDENT_AMBULATORY_CARE_PROVIDER_SITE_OTHER): Payer: 59 | Admitting: Physician Assistant

## 2014-03-01 VITALS — BP 172/84 | HR 87 | Ht 66.0 in | Wt 217.0 lb

## 2014-03-01 DIAGNOSIS — I10 Essential (primary) hypertension: Secondary | ICD-10-CM

## 2014-03-01 MED ORDER — OLMESARTAN MEDOXOMIL-HCTZ 40-25 MG PO TABS: 1.0000 | ORAL_TABLET | Freq: Every day | ORAL | Status: AC

## 2014-03-01 NOTE — Progress Notes (Signed)
   Subjective:    Patient ID: Marissa Ferrell, female    DOB: [birth year], 53 y.o.   MRN: [id number]  HPI Pt presents to the clinic to follow up on uncontrolled HTN. She has been on [place] 80mg  daily with little improvement. Denies any CP, palpitations, headaches or vision changes. Does not feel bad. When checks her blood pressure out of the office it runs about 150's over 90's. She had previously not wanted to try diuretic due to possible gout exacerbation. Pt has never had gout episode.    Review of Systems  All other systems reviewed and are negative.      Objective:   Physical Exam  Constitutional: She is oriented to person, place, and time. She appears well-developed and well-nourished.  HENT:  Head: Normocephalic and atraumatic.  Cardiovascular: Normal rate, regular rhythm and normal heart sounds.   Pulmonary/Chest: Effort normal and breath sounds normal.  Neurological: She is alert and oriented to person, place, and time.  Skin: Skin is dry.  Psychiatric: She has a normal mood and affect. Her behavior is normal.          Assessment & Plan:  HTN- uncontrolled. Pt is willing to try diuretic today. Pt did not want to try edarbi with diruretic she stated edarbi just didn't do anything. Samples of benicar 40mg  over 25mg  daily given for 3 weeks. Will recheck in office and see if BP coming down. If not we need to think about adding another BP medication. Low salt diet encouraged.

## 2014-03-07 ENCOUNTER — Telehealth: Payer: Self-pay | Admitting: *Deleted

## 2014-03-07 NOTE — Telephone Encounter (Signed)
Ok i placed on intolerance list was not there before. Our options are to try edarbiclor(you tried edarbi but did not give signifcant reduction), try a beta blocker different type of medication or to try clonadine ( a different type of medication). Have you tried either of the 2?

## 2014-03-07 NOTE — Telephone Encounter (Signed)
Pt states that her sister has the same problem and she is able to tolerate amlodipine with the diuretic.

## 2014-03-07 NOTE — Telephone Encounter (Signed)
We have already tried amlodipine and have recorded swelling of extremities.

## 2014-03-07 NOTE — Telephone Encounter (Signed)
Pt calls today stating that you started her on benicar at her last visit, which she had tried before, and her feet started to swell the very next day.

## 2014-03-08 NOTE — Telephone Encounter (Signed)
Marissa Ferrell is ok with starting clonidine. Please send to [facility].

## 2014-03-09 ENCOUNTER — Other Ambulatory Visit: Payer: Self-pay | Admitting: Physician Assistant

## 2014-03-09 MED ORDER — CLONIDINE HCL 0.1 MG PO TABS: 0.1000 mg | ORAL_TABLET | Freq: Two times a day (BID) | ORAL | Status: AC

## 2014-03-09 NOTE — Telephone Encounter (Signed)
Patient advised and states she will call back to schedule a follow up.

## 2014-03-09 NOTE — Telephone Encounter (Signed)
Sent rcheck in 1 month.

## 2014-03-10 ENCOUNTER — Telehealth: Payer: Self-pay | Admitting: *Deleted

## 2014-03-10 NOTE — Telephone Encounter (Signed)
Pt left vm wanting you to know that she has decided against taking the Clonidine.  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

## 2014-03-10 NOTE — Telephone Encounter (Signed)
Then we really should try BB or different Calcium channel blocker? Do you have a preference?

## 2014-03-13 NOTE — Telephone Encounter (Signed)
Metroprolol- beta blocker  Verapamil- calcium channel blocker

## 2014-03-13 NOTE — Telephone Encounter (Signed)
Can you give me some examples of these meds so I can tell her the options when I call her?

## 2014-03-27 NOTE — Telephone Encounter (Signed)
LMOM for pt to return call. 

## 2014-05-01 ENCOUNTER — Encounter: Payer: Self-pay | Admitting: Physician Assistant

## 2015-12-12 ENCOUNTER — Encounter: Payer: Self-pay | Admitting: Physician Assistant

## 2015-12-12 ENCOUNTER — Ambulatory Visit (INDEPENDENT_AMBULATORY_CARE_PROVIDER_SITE_OTHER): Payer: BLUE CROSS/BLUE SHIELD | Admitting: Physician Assistant

## 2015-12-12 VITALS — BP 130/66 | HR 79 | Ht 66.0 in | Wt 223.0 lb

## 2015-12-12 DIAGNOSIS — Q828 Other specified congenital malformations of skin: Secondary | ICD-10-CM | POA: Diagnosis not present

## 2015-12-12 DIAGNOSIS — L919 Hypertrophic disorder of the skin, unspecified: Secondary | ICD-10-CM | POA: Diagnosis not present

## 2015-12-12 NOTE — Progress Notes (Signed)
   Subjective:    Patient ID: Marissa Ferrell, female    DOB: 1960/12/14, 55 y.o.   MRN: [id number]  HPI Pt comes in to have skin tags removed. They are located on right axilla and right upper thigh. They are very irritated and bleed when shaving and runs across them.    Review of Systems  All other systems reviewed and are negative.      Objective:   Physical Exam  Constitutional: She is oriented to person, place, and time. She appears well-developed and well-nourished.  HENT:  Head: Normocephalic and atraumatic.  Cardiovascular: Normal rate, regular rhythm and normal heart sounds.   Pulmonary/Chest: Effort normal and breath sounds normal.  Neurological: She is alert and oriented to person, place, and time.  Skin:     Psychiatric: She has a normal mood and affect. Her behavior is normal.          Assessment & Plan:  Accessory skin tags-  Skin Tag Removal Procedure Note  Pre-operative Diagnosis: Classic skin tags (acrochordon)  Post-operative Diagnosis: Classic skin tags (acrochordon)  Locations:right axilla and right upper inner thigh  Indications: irritation and bleeding.   Anesthesia: ethyl chloride  Procedure Details  The risks (including bleeding and infection) and benefits of the procedure and Verbal informed consent obtained. Using sterile iris scissors, multiple skin tags were snipped off at their bases after cleansing with Betadine.  Bleeding was controlled by pressure.   Findings: Pathognomonic benign lesions  not sent for pathological exam.  Condition: Stable  Complications: none.  Plan: 1. Instructed to keep the wounds dry and covered for 24-48h and clean thereafter. 2. Warning signs of infection were reviewed.   3. Recommended that the patient use OTC acetaminophen as needed for pain.  4. Return as needed.
